# Patient Record
Sex: Female | Born: 1990 | Race: Black or African American | Hispanic: No | Marital: Single | Smoking: Never smoker
Health system: Southern US, Community
[De-identification: ages and names within clinical notes are randomized; demographics above are authoritative.]

## PROBLEM LIST (undated history)

## (undated) ENCOUNTER — Inpatient Hospital Stay (HOSPITAL_COMMUNITY): Payer: Self-pay

## (undated) DIAGNOSIS — Z789 Other specified health status: Secondary | ICD-10-CM

## (undated) HISTORY — PX: MULTIPLE TOOTH EXTRACTIONS: SHX2053

## (undated) HISTORY — PX: THERAPEUTIC ABORTION: SHX798

## (undated) HISTORY — PX: WISDOM TOOTH EXTRACTION: SHX21

---
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
## 2013-02-06 ENCOUNTER — Encounter (HOSPITAL_BASED_OUTPATIENT_CLINIC_OR_DEPARTMENT_OTHER): Payer: Self-pay | Admitting: *Deleted

## 2013-02-06 ENCOUNTER — Emergency Department (HOSPITAL_BASED_OUTPATIENT_CLINIC_OR_DEPARTMENT_OTHER)
Admission: EM | Admit: 2013-02-06 | Discharge: 2013-02-06 | Disposition: A | Discharge status: Home / Self Care | Payer: Medicaid Other | Attending: Emergency Medicine | Admitting: Emergency Medicine

## 2013-02-06 DIAGNOSIS — R05 Cough: Secondary | ICD-10-CM | POA: Insufficient documentation

## 2013-02-06 DIAGNOSIS — J3489 Other specified disorders of nose and nasal sinuses: Secondary | ICD-10-CM | POA: Insufficient documentation

## 2013-02-06 DIAGNOSIS — J069 Acute upper respiratory infection, unspecified: Secondary | ICD-10-CM | POA: Insufficient documentation

## 2013-02-06 DIAGNOSIS — J029 Acute pharyngitis, unspecified: Secondary | ICD-10-CM | POA: Insufficient documentation

## 2013-02-06 DIAGNOSIS — R059 Cough, unspecified: Secondary | ICD-10-CM | POA: Insufficient documentation

## 2013-02-06 NOTE — ED Provider Notes (Signed)
History     CSN: [id number]  Arrival date & time 02/06/13  1257   First MD Initiated Contact with Patient 02/06/13 1316      Chief Complaint  Patient presents with  . Otalgia    (Consider location/radiation/quality/duration/timing/severity/associated sxs/prior treatment) Patient is a 22 y.o. female presenting with ear pain. The history is provided by the patient. No language interpreter was used.  Otalgia Location:  Bilateral Behind ear:  No abnormality Quality:  Aching Onset quality:  Gradual Duration:  4 days Timing:  Constant Progression:  Unchanged Chronicity:  New Relieved by:  Nothing Worsened by:  Nothing tried Associated symptoms: congestion, cough, rhinorrhea and sore throat   Associated symptoms: no ear discharge, no fever and no rash     History reviewed. No pertinent past medical history.  History reviewed. No pertinent past surgical history.  No family history on file.  History  Substance Use Topics  . Smoking status: Never Smoker   . Smokeless tobacco: Not on file  . Alcohol Use: No    OB History   Grav Para Term Preterm Abortions TAB SAB Ect Mult Living                  Review of Systems  Constitutional: Negative for fever.  HENT: Positive for ear pain, congestion, sore throat and rhinorrhea. Negative for ear discharge.   Respiratory: Positive for cough.   Cardiovascular: Negative.   Skin: Negative for rash.    Allergies  Penicillins  Home Medications  No current outpatient prescriptions on file.  BP 121/70  Pulse 92  Temp(Src) 98.6 F (37 C) (Oral)  SpO2 99%  Physical Exam  Nursing note and vitals reviewed. Constitutional: She is oriented to person, place, and time. She appears well-developed and well-nourished.  HENT:  Head: Normocephalic and atraumatic.  Right Ear: External ear normal.  Left Ear: External ear normal.  Nose: Rhinorrhea present.  Mouth/Throat: Posterior oropharyngeal erythema present.  Eyes: Conjunctivae  are normal. Pupils are equal, round, and reactive to light.  Neck: Normal range of motion. Neck supple.  Cardiovascular: Normal rate and regular rhythm.   Pulmonary/Chest: Effort normal and breath sounds normal.  Musculoskeletal: Normal range of motion.  Neurological: She is alert and oriented to person, place, and time.  Skin: Skin is warm and dry.    ED Course  Procedures (including critical care time)  Labs Reviewed - No data to display No results found.   1. URI (upper respiratory infection)       MDM  Don't think pt needs antibiotics or imaging at this time:pt instructed on otc medications to use for symptoms        Teressa Lower, NP 02/06/13 1336

## 2013-02-06 NOTE — ED Notes (Signed)
Ear pain and sore throat x 4 days.

## 2013-02-06 NOTE — ED Provider Notes (Signed)
Medical screening examination/treatment/procedure(s) were performed by non-physician practitioner and as supervising physician I was immediately available for consultation/collaboration.   Robynn Marcel, MD 02/06/13 1617 

## 2013-12-12 ENCOUNTER — Encounter (HOSPITAL_BASED_OUTPATIENT_CLINIC_OR_DEPARTMENT_OTHER): Payer: Self-pay | Admitting: Emergency Medicine

## 2013-12-12 ENCOUNTER — Emergency Department (HOSPITAL_BASED_OUTPATIENT_CLINIC_OR_DEPARTMENT_OTHER)
Admission: EM | Admit: 2013-12-12 | Discharge: 2013-12-12 | Disposition: A | Discharge status: Home / Self Care | Payer: Medicaid Other | Attending: Emergency Medicine | Admitting: Emergency Medicine

## 2013-12-12 DIAGNOSIS — J069 Acute upper respiratory infection, unspecified: Secondary | ICD-10-CM | POA: Insufficient documentation

## 2013-12-12 DIAGNOSIS — Z880 Allergy status to penicillin: Secondary | ICD-10-CM | POA: Insufficient documentation

## 2013-12-12 LAB — RAPID STREP SCREEN (MED CTR MEBANE ONLY): Streptococcus, Group A Screen (Direct): NEGATIVE

## 2013-12-12 MED ORDER — LORATADINE 10 MG PO TABS: 10.0000 mg | ORAL_TABLET | Freq: Every day | ORAL | Status: DC

## 2013-12-12 NOTE — ED Provider Notes (Signed)
CSN: [id number]     Arrival date & time 12/12/13  1125 History   First MD Initiated Contact with Patient 12/12/13 1230     Chief Complaint  Patient presents with  . URI   (Consider location/radiation/quality/duration/timing/severity/associated sxs/prior Treatment) Patient is a 23 y.o. female presenting with URI. The history is provided by the patient.  URI Presenting symptoms: congestion, fever and sore throat   Presenting symptoms: no cough and no ear pain   Severity:  Moderate Onset quality:  Gradual Duration:  3 days Timing:  Constant Progression:  Worsening Chronicity:  New Relieved by:  Nothing Worsened by:  Nothing tried Ineffective treatments:  None tried Associated symptoms: headaches, myalgias and swollen glands    Donie Moulton is a 23 y.o. female who presents to the ED with nasal congestion, throat irritation and aching that started 3 days ago. She denies ear ache or cough. She has a low grade fever.   History reviewed. No pertinent past medical history. History reviewed. No pertinent past surgical history. No family history on file. History  Substance Use Topics  . Smoking status: Never Smoker   . Smokeless tobacco: Not on file  . Alcohol Use: No   OB History   Grav Para Term Preterm Abortions TAB SAB Ect Mult Living                 Review of Systems  Constitutional: Positive for fever.  HENT: Positive for congestion and sore throat. Negative for ear pain, sinus pressure and trouble swallowing.   Respiratory: Negative for cough and chest tightness.   Gastrointestinal: Negative for nausea, vomiting and abdominal pain.  Genitourinary: Negative for dysuria, urgency and frequency.  Musculoskeletal: Positive for myalgias.  Skin: Negative for rash.  Neurological: Positive for headaches. Negative for light-headedness.  Psychiatric/Behavioral: Negative for confusion. The patient is not nervous/anxious.     Allergies  Penicillins  Home Medications   Current  Outpatient Rx  Name  Route  Sig  Dispense  Refill  . medroxyPROGESTERone (DEPO-PROVERA) 150 MG/ML injection   Intramuscular   Inject 150 mg into the muscle every 3 (three) months.          BP 124/75  Temp(Src) 99.5 F (37.5 C) (Oral)  Resp 20  Ht 5\' 3"  (1.6 m)  Wt 136 lb 9 oz (61.944 kg)  BMI 24.20 kg/m2  SpO2 99% Physical Exam  Nursing note and vitals reviewed. Constitutional: She is oriented to person, place, and time. She appears well-developed and well-nourished.  HENT:  Head: Normocephalic and atraumatic.  Right Ear: Tympanic membrane normal.  Left Ear: Tympanic membrane normal.  Nose: Nose normal.  Mouth/Throat: Uvula is midline and mucous membranes are normal. Posterior oropharyngeal erythema present.  Eyes: Conjunctivae and EOM are normal.  Neck: Normal range of motion. Neck supple.  Cardiovascular: Normal rate, regular rhythm and normal heart sounds.   Pulmonary/Chest: Effort normal. No respiratory distress. She has no wheezes. She has no rales. She exhibits no tenderness.  Abdominal: Soft. There is no tenderness.  Musculoskeletal: Normal range of motion.  Lymphadenopathy:    She has cervical adenopathy (mild).  Neurological: She is alert and oriented to person, place, and time. No cranial nerve deficit.  Skin: Skin is warm and dry.  Psychiatric: She has a normal mood and affect. Her behavior is normal.   Results for orders placed during the hospital encounter of 12/12/13 (from the past 24 hour(s))  RAPID STREP SCREEN     Status: None   Collection  Time    12/12/13 12:45 PM      Result Value Range   Streptococcus, Group A Screen (Direct) NEGATIVE  NEGATIVE    ED Course  Procedures   MDM  23 y.o. female with congestion and sore throat. Stable for discharge without further screening. Negative strep screen. Will treat for congestion and she will take tylenol and ibuprofen as needed for aching. She will follow up with her PCP or return here as needed. She will use  humidifier. Discussed with the patient clinical and lab findings and plan of care. All questioned fully answered. She will returnif any problems arise.    Medication List    TAKE these medications       loratadine 10 MG tablet  Commonly known as:  CLARITIN  Take 1 tablet (10 mg total) by mouth daily.      ASK your doctor about these medications       medroxyPROGESTERone 150 MG/ML injection  Commonly known as:  DEPO-PROVERA  Inject 150 mg into the muscle every 3 (three) months.           [facility] Orlene OchM Kirsti Mcalpine, NP 12/12/13 1330

## 2013-12-12 NOTE — ED Notes (Signed)
Sinus congestion and sore throat for the last three days.

## 2013-12-12 NOTE — Discharge Instructions (Signed)
Cool Mist Vaporizers °Vaporizers may help relieve the symptoms of a cough and cold. They add moisture to the air, which helps mucus to become thinner and less sticky. This makes it easier to breathe and cough up secretions. Cool mist vaporizers do not cause serious burns like hot mist vaporizers ("steamers, humidifiers"). Vaporizers have not been proved to show they help with colds. You should not use a vaporizer if you are allergic to mold.  °HOME CARE INSTRUCTIONS °· Follow the package instructions for the vaporizer. °· Do not use anything other than distilled water in the vaporizer. °· Do not run the vaporizer all of the time. This can cause mold or bacteria to grow in the vaporizer. °· Clean the vaporizer after each time it is used. °· Clean and dry the vaporizer well before storing it. °· Stop using the vaporizer if worsening respiratory symptoms develop. °Document Released: 08/24/2004 Document Revised: 07/30/2013 Document Reviewed: 04/16/2013 °[employer]® Patient Information ©2014 [employer]. ° °

## 2013-12-12 NOTE — ED Provider Notes (Signed)
Medical screening examination/treatment/procedure(s) were performed by non-physician practitioner and as supervising physician I was immediately available for consultation/collaboration.     Macintyre Alexa, MD 12/12/13 1503 

## 2013-12-14 LAB — CULTURE, GROUP A STREP

## 2014-09-24 ENCOUNTER — Emergency Department (HOSPITAL_BASED_OUTPATIENT_CLINIC_OR_DEPARTMENT_OTHER)
Admission: EM | Admit: 2014-09-24 | Discharge: 2014-09-24 | Disposition: A | Discharge status: Home / Self Care | Payer: Medicaid Other | Attending: Emergency Medicine | Admitting: Emergency Medicine

## 2014-09-24 ENCOUNTER — Encounter (HOSPITAL_BASED_OUTPATIENT_CLINIC_OR_DEPARTMENT_OTHER): Payer: Self-pay | Admitting: Emergency Medicine

## 2014-09-24 DIAGNOSIS — R197 Diarrhea, unspecified: Secondary | ICD-10-CM | POA: Diagnosis not present

## 2014-09-24 DIAGNOSIS — Z3202 Encounter for pregnancy test, result negative: Secondary | ICD-10-CM | POA: Diagnosis not present

## 2014-09-24 DIAGNOSIS — Z880 Allergy status to penicillin: Secondary | ICD-10-CM | POA: Diagnosis not present

## 2014-09-24 DIAGNOSIS — Z79899 Other long term (current) drug therapy: Secondary | ICD-10-CM | POA: Diagnosis not present

## 2014-09-24 DIAGNOSIS — R1084 Generalized abdominal pain: Secondary | ICD-10-CM | POA: Insufficient documentation

## 2014-09-24 DIAGNOSIS — R109 Unspecified abdominal pain: Secondary | ICD-10-CM | POA: Diagnosis present

## 2014-09-24 LAB — URINALYSIS, ROUTINE W REFLEX MICROSCOPIC
Bilirubin Urine: NEGATIVE
Glucose, UA: NEGATIVE mg/dL
Hgb urine dipstick: NEGATIVE
Ketones, ur: 15 mg/dL — AB
Leukocytes, UA: NEGATIVE
Nitrite: NEGATIVE
Protein, ur: NEGATIVE mg/dL
Specific Gravity, Urine: 1.033 — ABNORMAL HIGH (ref 1.005–1.030)
Urobilinogen, UA: 1 mg/dL (ref 0.0–1.0)
pH: 5.5 (ref 5.0–8.0)

## 2014-09-24 LAB — PREGNANCY, URINE: Preg Test, Ur: NEGATIVE

## 2014-09-24 NOTE — ED Provider Notes (Signed)
Medical screening examination/treatment/procedure(s) were performed by non-physician practitioner and as supervising physician I was immediately available for consultation/collaboration.    Nelia Shiobert L Hans Rusher, MD 09/24/14 [phone]

## 2014-09-24 NOTE — ED Notes (Signed)
abd pain, nausea, diarrhea-states LMP 5 months ago after lat depoprovera

## 2014-09-24 NOTE — ED Provider Notes (Signed)
CSN: [id number]     Arrival date & time 09/24/14  1545 History   First MD Initiated Contact with Patient 09/24/14 1605     Chief Complaint  Patient presents with  . Abdominal Pain     (Consider location/radiation/quality/duration/timing/severity/associated sxs/prior Treatment) HPI Comments: Pt comes in with complaints of generalized abdominal pain for the last couple of weeks. Pt states that the pain worsened yesterday when she developed diarrhea. She states that she has had multiple diarrhea episodes, that are not bloody. No fever, vomiting, dysuria or vaginal discharge.  The history is provided by the patient. No language interpreter was used.    History reviewed. No pertinent past medical history. History reviewed. No pertinent past surgical history. No family history on file. History  Substance Use Topics  . Smoking status: Never Smoker   . Smokeless tobacco: Not on file  . Alcohol Use: No   OB History   Grav Para Term Preterm Abortions TAB SAB Ect Mult Living                 Review of Systems  All other systems reviewed and are negative.     Allergies  Penicillins  Home Medications   Prior to Admission medications   Medication Sig Start Date End Date Taking? Authorizing Provider  loratadine (CLARITIN) 10 MG tablet Take 1 tablet (10 mg total) by mouth daily. 12/12/13   Hope Orlene OchM Neese, NP  medroxyPROGESTERone (DEPO-PROVERA) 150 MG/ML injection Inject 150 mg into the muscle every 3 (three) months.    Historical Provider, MD   BP 123/82  Pulse 98  Temp(Src) 98.9 F (37.2 C) (Oral)  Resp 16  Ht 5\' 3"  (1.6 m)  Wt 155 lb 8 oz (70.534 kg)  BMI 27.55 kg/m2  SpO2 100% Physical Exam  Nursing note and vitals reviewed. Constitutional: She is oriented to person, place, and time. She appears well-developed and well-nourished.  HENT:  Head: Normocephalic and atraumatic.  Cardiovascular: Normal rate and regular rhythm.   Pulmonary/Chest: Effort normal and breath sounds  normal.  Abdominal: Bowel sounds are normal. There is no tenderness.  Musculoskeletal: Normal range of motion.  Neurological: She is alert and oriented to person, place, and time.  Skin: Skin is warm.  Psychiatric: She has a normal mood and affect.    ED Course  Procedures (including critical care time) Labs Review Labs Reviewed  URINALYSIS, ROUTINE W REFLEX MICROSCOPIC - Abnormal; Notable for the following:    APPearance CLOUDY (*)    Specific Gravity, Urine 1.033 (*)    Ketones, ur 15 (*)    All other components within normal limits  PREGNANCY, URINE    Imaging Review No results found.   EKG Interpretation None      MDM   Final diagnoses:  Diarrhea  Generalized abdominal pain    Abdomen is benign. Diarrhea is likely viral. Discussed follow upas needed.    Teressa LowerVrinda Trevan Messman, NP 09/24/14 1652

## 2014-09-24 NOTE — Discharge Instructions (Signed)

## 2014-11-24 ENCOUNTER — Encounter (HOSPITAL_BASED_OUTPATIENT_CLINIC_OR_DEPARTMENT_OTHER): Payer: Self-pay | Admitting: Emergency Medicine

## 2014-11-24 ENCOUNTER — Emergency Department (HOSPITAL_BASED_OUTPATIENT_CLINIC_OR_DEPARTMENT_OTHER)
Admission: EM | Admit: 2014-11-24 | Discharge: 2014-11-24 | Disposition: A | Discharge status: Home / Self Care | Payer: Medicaid Other | Attending: Emergency Medicine | Admitting: Emergency Medicine

## 2014-11-24 DIAGNOSIS — Z3202 Encounter for pregnancy test, result negative: Secondary | ICD-10-CM | POA: Diagnosis not present

## 2014-11-24 DIAGNOSIS — Z79899 Other long term (current) drug therapy: Secondary | ICD-10-CM | POA: Insufficient documentation

## 2014-11-24 DIAGNOSIS — Z202 Contact with and (suspected) exposure to infections with a predominantly sexual mode of transmission: Secondary | ICD-10-CM

## 2014-11-24 DIAGNOSIS — Z880 Allergy status to penicillin: Secondary | ICD-10-CM | POA: Diagnosis not present

## 2014-11-24 LAB — URINALYSIS, ROUTINE W REFLEX MICROSCOPIC
Bilirubin Urine: NEGATIVE
Glucose, UA: NEGATIVE mg/dL
Hgb urine dipstick: NEGATIVE
Ketones, ur: 15 mg/dL — AB
Leukocytes, UA: NEGATIVE
Nitrite: NEGATIVE
Protein, ur: NEGATIVE mg/dL
Specific Gravity, Urine: 1.031 — ABNORMAL HIGH (ref 1.005–1.030)
Urobilinogen, UA: 0.2 mg/dL (ref 0.0–1.0)
pH: 5 (ref 5.0–8.0)

## 2014-11-24 LAB — WET PREP, GENITAL
Trich, Wet Prep: NONE SEEN
Yeast Wet Prep HPF POC: NONE SEEN

## 2014-11-24 LAB — PREGNANCY, URINE: Preg Test, Ur: NEGATIVE

## 2014-11-24 MED ORDER — LIDOCAINE HCL (PF) 1 % IJ SOLN
INTRAMUSCULAR | Status: AC
Start: 2014-11-24 — End: 2014-11-24
  Administered 2014-11-24: 0.9 mL
  Filled 2014-11-24: qty 5

## 2014-11-24 MED ORDER — CEFTRIAXONE SODIUM 250 MG IJ SOLR
250.0000 mg | Freq: Once | INTRAMUSCULAR | Status: AC
  Administered 2014-11-24: 250 mg via INTRAMUSCULAR
  Filled 2014-11-24: qty 250

## 2014-11-24 MED ORDER — AZITHROMYCIN 250 MG PO TABS
1000.0000 mg | ORAL_TABLET | Freq: Once | ORAL | Status: AC
  Administered 2014-11-24: 1000 mg via ORAL
  Filled 2014-11-24: qty 4

## 2014-11-24 NOTE — ED Provider Notes (Signed)
CSN: [id number]     Arrival date & time 11/24/14  1015 History   First MD Initiated Contact with Patient 11/24/14 1353     Chief Complaint  Patient presents with  . SEXUALLY TRANSMITTED DISEASE   HPI  Patient is a 23 year old female who presents emergency room after being exposed to gonorrhea. She states that her boyfriend told her that he was exposed to gonorrhea. Patient states that her boyfriend was treated. She has not had any symptoms. She states that she has had no vaginal discharge, dysuria, urinary frequency, vaginal pain, dyspareunia, urinary urgency, abdominal pain, nausea, or vomiting. She states that she is otherwise healthy. She takes no medications.  History reviewed. No pertinent past medical history. History reviewed. No pertinent past surgical history. History reviewed. No pertinent family history. History  Substance Use Topics  . Smoking status: Never Smoker   . Smokeless tobacco: Not on file  . Alcohol Use: No   OB History    No data available     Review of Systems  Constitutional: Negative for fever, chills and fatigue.  Respiratory: Negative for chest tightness and shortness of breath.   Cardiovascular: Negative for chest pain and palpitations.  Gastrointestinal: Negative for nausea, vomiting, abdominal pain, diarrhea and constipation.  Genitourinary: Negative for dysuria, frequency, hematuria, vaginal bleeding, vaginal discharge, difficulty urinating and vaginal pain.  All other systems reviewed and are negative.     Allergies  Penicillins  Home Medications   Prior to Admission medications   Medication Sig Start Date End Date Taking? Authorizing Provider  loratadine (CLARITIN) 10 MG tablet Take 1 tablet (10 mg total) by mouth daily. 12/12/13   Hope Orlene OchM Neese, NP  medroxyPROGESTERone (DEPO-PROVERA) 150 MG/ML injection Inject 150 mg into the muscle every 3 (three) months.    Historical Provider, MD   BP 114/67 mmHg  Pulse 86  Temp(Src) 99 F (37.2 C)  (Oral)  Resp 18  Wt 148 lb (67.132 kg)  SpO2 100%  LMP 04/24/2014 Physical Exam  Constitutional: She is oriented to person, place, and time. She appears well-developed and well-nourished. No distress.  HENT:  Head: Normocephalic and atraumatic.  Mouth/Throat: Oropharynx is clear and moist. No oropharyngeal exudate.  Eyes: Conjunctivae and EOM are normal. Pupils are equal, round, and reactive to light. No scleral icterus.  Neck: Normal range of motion. Neck supple. No JVD present. No thyromegaly present.  Cardiovascular: Normal rate, regular rhythm, normal heart sounds and intact distal pulses.  Exam reveals no gallop and no friction rub.   No murmur heard. Pulmonary/Chest: Effort normal and breath sounds normal. No respiratory distress. She has no wheezes. She has no rales. She exhibits no tenderness.  Abdominal: Soft. Bowel sounds are normal. She exhibits no distension and no mass. There is no tenderness. There is no rebound and no guarding.  Genitourinary: No labial fusion. There is no rash, tenderness, lesion or injury on the right labia. There is no tenderness, lesion or injury on the left labia. Uterus is not deviated, not enlarged, not fixed and not tender. Cervix exhibits no motion tenderness, no discharge and no friability. Right adnexum displays no mass, no tenderness and no fullness. Left adnexum displays no mass, no tenderness and no fullness. No erythema, tenderness or bleeding in the vagina. No foreign body around the vagina. No signs of injury around the vagina. No vaginal discharge found.  Musculoskeletal: Normal range of motion.  Lymphadenopathy:    She has no cervical adenopathy.  Neurological: She is alert and  oriented to person, place, and time.  Skin: Skin is warm and dry. She is not diaphoretic.  Psychiatric: She has a normal mood and affect. Her behavior is normal. Judgment and thought content normal.  Nursing note and vitals reviewed.   ED Course  Procedures  (including critical care time) Labs Review Labs Reviewed  WET PREP, GENITAL - Abnormal; Notable for the following:    Clue Cells Wet Prep HPF POC FEW (*)    WBC, Wet Prep HPF POC FEW (*)    All other components within normal limits  URINALYSIS, ROUTINE W REFLEX MICROSCOPIC - Abnormal; Notable for the following:    APPearance CLOUDY (*)    Specific Gravity, Urine 1.031 (*)    Ketones, ur 15 (*)    All other components within normal limits  GC/CHLAMYDIA PROBE AMP  PREGNANCY, URINE    Imaging Review No results found.   EKG Interpretation None      MDM   Final diagnoses:  Exposure to gonorrhea   Patient is a 23 year old female who presents emergency room for evaluation after being exposed to gonorrhea. Physical exam is unremarkable. There is no CMT, adnexal tenderness, or purulent vaginal discharge on pelvic exam. No tenderness. Vital signs are stable. I have treated the patient presumptively with ceftriaxone and azithromycin 1 g. Patient has to leave the emergency room and cannot stay for wet prep results. She states that she will call and follow up on her results. I told her to abstain from sexual activity for 1 week. Her partner has already been tested. I have recommended using condoms for further STD protection. Patient refused HIV and RPR test here in the ED. I doubt PID or TOA at this time. Patient is stable for discharge. Patient to return for abdominal pain, worsening vaginal discharge, or any other concerning symptoms. She states understanding and agreement.    Eben Burowourtney A Forcucci, PA-C 11/24/14 1456  Vanetta MuldersScott Zackowski, MD 11/26/14 [phone]

## 2014-11-24 NOTE — Discharge Instructions (Signed)
Gonorrhea Culture This is a test for Neisseria gonorrhoeae, the bacteria that causes the sexually transmitted disease gonorrhea. Gonorrhea is easily treated but can cause severe reproductive and other health problems if left untreated. A swab is used to get a sample of secretion or discharge from the infected area such as the cervix, urethra, penis, anus, or throat. A urine sample is used in some tests. Many caregivers will take a sample from more than one body site to increase the likelihood of finding the bacteria.  NORMAL FINDINGS Your culture should be negative. This means that this culture showed no growth for gonorrhea. Ranges for normal findings may vary among different laboratories and hospitals. You should always check with your doctor after having lab work or other tests done to discuss the meaning of your test results and whether your values are considered within normal limits. MEANING OF TEST  Your caregiver will go over the test results with you and discuss the importance and meaning of your results, as well as treatment options and the need for additional tests. OBTAINING THE TEST RESULTS It is your responsibility to obtain your test results. Ask the lab or department performing the test when and how you will get your results. Document Released: 12/29/2004 Document Revised: 02/19/2012 Document Reviewed: 03/04/2014 [facility] Patient Information 2015 [place], [place]. This information is not intended to replace advice given to you by your health care provider. Make sure you discuss any questions you have with your health care provider.  Gonorrhea Gonorrhea is an infection that can cause serious problems. If left untreated, the infection may:   Damage the female or female organs.   Cause women to be unable to have children (sterility).   Harm a fetus if the infected woman is pregnant.  It is important to get treatment for gonorrhea as soon as possible. It is also necessary that all  your sexual partners be tested for the infection.  CAUSES  Gonorrhea is caused by bacteria called Neisseria gonorrhoeae. The infection is spread from person to person, usually by sexual contact (such as by anal, vaginal, or oral means). A newborn can contract the infection from his or her mother during birth.  SYMPTOMS  Some people with gonorrhea do not have symptoms. Symptoms may be different in females and males.  Females The most common symptoms are:   Pain in the lower abdomen.   Fever with or without chills.  Other symptoms include:   Abnormal vaginal discharge.   Painful intercourse.   Burning or itching of the vagina or lips of the vagina.   Abnormal vaginal bleeding.   Pain when urinating.   Long-lasting (chronic) pain in the lower abdomen, especially during menstruation or intercourse.   Inability to become pregnant.   Going into premature labor.   Irritation, pain, bleeding, or discharge from the rectum. This may occur if the infection was spread by anal sex.   Sore throat or swollen lymph nodes in the neck. This may occur if the infection was spread by oral sex.  Males The most common symptoms are:   Discharge from the penis.   Pain or burning during urination.   Pain or swelling in the testicles. Other symptoms may include:   Irritation, pain, bleeding, or discharge from the rectum. This may occur if the infection was spread by anal sex.   Sore throat, fever, or swollen lymph nodes in the neck. This may occur if the infection was spread by oral sex.  DIAGNOSIS  A diagnosis  is made after a physical exam is done and a sample of discharge is examined under a microscope for the presence of the bacteria. The discharge may be taken from the urethra, cervix, throat, or rectum.  TREATMENT  Gonorrhea is treated with antibiotic medicines. It is important for treatment to begin as soon as possible. Early treatment may prevent some problems from  developing.  HOME CARE INSTRUCTIONS   Take medicines only as directed by your health care provider.   Take your antibiotic medicine as directed by your health care provider. Finish the antibiotic even if you start to feel better. Incomplete treatment will put you at risk for continued infection.   Do not have sex until treatment is complete or as directed by your health care provider.   Keep all follow-up visits as directed by your health care provider.   Not all test results are available during your visit. If your test results are not back during the visit, make an appointment with your health care provider to find out the results. Do not assume everything is normal if you have not heard from your health care provider or the medical facility. It is your responsibility to get your test results.  If you test positive for gonorrhea, inform your recent sexual partners. They need to be checked for gonorrhea even if they do not have symptoms. They may need treatment, even if they test negative for gonorrhea.  SEEK MEDICAL CARE IF:   You develop any bad reaction to the medicine you were prescribed. This may include:   A rash.   Nausea.   Vomiting.   Diarrhea.   Your symptoms do not improve after a few days of taking antibiotics.   Your symptoms get worse.   You develop increased pain, such as in the testicles (for males) or in the abdomen (for females).  You have a fever. MAKE SURE YOU:   Understand these instructions.  Will watch your condition.  Will get help right away if you are not doing well or get worse. Document Released: 11/24/2000 Document Revised: 04/13/2014 Document Reviewed: 06/04/2013 [facility] Patient Information 2015 [place], [place]. This information is not intended to replace advice given to you by your health care provider. Make sure you discuss any questions you have with your health care provider.   Emergency Department Resource Guide 1) Find a  Doctor and Pay Out of Pocket Although you won't have to find out who is covered by your insurance plan, it is a good idea to ask around and get recommendations. You will then need to call the office and see if the doctor you have chosen will accept you as a new patient and what types of options they offer for patients who are self-pay. Some doctors offer discounts or will set up payment plans for their patients who do not have insurance, but you will need to ask so you aren't surprised when you get to your appointment.  2) Contact Your Local Health Department Not all health departments have doctors that can see patients for sick visits, but many do, so it is worth a call to see if yours does. If you don't know where your local health department is, you can check in your phone book. The CDC also has a tool to help you locate your state's health department, and many state websites also have listings of all of their local health departments.  3) Find a Walk-in Clinic If your illness is not likely to be very severe  or complicated, you may want to try a walk in clinic. These are popping up all over the country in pharmacies, drugstores, and shopping centers. They're usually staffed by nurse practitioners or physician assistants that have been trained to treat common illnesses and complaints. They're usually fairly quick and inexpensive. However, if you have serious medical issues or chronic medical problems, these are probably not your best option.  No Primary Care Doctor: - Call Health Connect at  [phone] - they can help you locate a primary care doctor that  accepts your insurance, provides certain services, etc. - Physician Referral Service- [phone]  Chronic Pain Problems: Organization         Address  Phone   Notes  [facility]  [phone] Patients need to be referred by their primary care doctor.   Medication Assistance: Organization         Address  Phone    Notes  [facility] Medication [facility] [address] [place], [place] [address] [phone] --Must be a resident of [facility] -- Must have NO insurance coverage whatsoever (no Medicaid/ Medicare, etc.) -- The pt. MUST have a primary care doctor that directs their care regularly and follows them in the community   Med[facility]ist  [phone]   [employer]  [phone]    Agencies that provide inexpensive medical care: Organization         Address  Phone   Notes  [facility]  [phone]   [facility]    [phone]   [facility] [address] [place], [place] [address] [phone]   [facility] [address] [place], [place] [phone]   Planned Parenthood    [phone]   [facility]    [phone]   Community [facility] and [facility]  [address], [place] Phone:  [phone], Fax:  [phone] Hours of Operation:  9 am - 6 pm, M-F.  Also accepts Medicaid/Medicare and self-pay.  [facility]  [address], [place] Phone: [phone], Fax: [phone]. Hours of Operation:  8:30 am - 5:30 pm, M-F.  Also accepts Medicaid and self-pay.  [facility] [place] [address], [place] Phone: [phone]   [facility] [address] Natasha Bence [place], [place] [phone], Ext. [phone] Mondays & Thursdays: 7-9 AM.  First 15 patients are seen on a first come, first serve basis.    Medicaid-accepting [facility] Providers:  Organization         Address  Phone   Notes  [facility] [address],  [phone] Also accepts self-pay patients.  [facility] [address] Laurell Josephs [place], [place]  [phone]   [facility] [address], [place] [phone]   [facility]  [facility] [address], [place] [phone]   Renaye Rakers [address], [place]   [phone] Only accepts [place] Access [place] patients after they have their name applied to their card.   Self-Pay (no insurance) in [facility]:  Organization         Address  Phone   Notes  Sickle Cell Patients, [facility] Medicine [address] [place], [place] [phone]   [facility] Urgent Care [address], [place] [phone]   [facility]  [address], [place] [phone]) [phone]   [facility]/Dr. Osei-Bonsu  [address] [place] [address], [place] or [phone], [place] [phone]) [phone] Phone number for both [place] and [place] locations is the same.  [facility]gent Medical and [facility] [address], [place] [phone](336) [phone]   [facility] [address] [place] [address], [place] or [address] [phone]) [phone]   [facility] [address], [place] [phone], phone; [phone](336) [phone], fax Sees patients 1st and 3rd Saturday of every month.  Must not qualify for public or private insurance (i.e. Medicaid, Medicare, [place] Health Choice, Veterans' Benefits)  Household income should be no more than 200% of the poverty level The clinic cannot treat you if you are pregnant or think you are pregnant  Sexually transmitted diseases are not treated at the clinic.    Dental Care: Organization         Address  Phone  Notes  [facility] [address], [place] [phone] Accepts children up to age 21 who are enrolled in [place] or [place] Health Choice; pregnant women with a Medicaid card; and children who have applied for Medicaid or [place] Health Choice, but were declined, whose parents can pay a reduced fee at time of service.  [facility] [place]  [address], [place] [phone]) [phone] Accepts children up to age 21 who are enrolled in [place] or [place] Health Choice; pregnant women with a Medicaid card; and children who have applied for Medicaid or [place] Health Choice, but were declined, whose parents can pay a reduced fee at time of service.  [facility]  [address], [place] [phone] Patients are seen by appointment only. Walk-ins are not accepted. [facility] will see patients 21 years of age and older. Monday - Tuesday (8am-5pm) Most Wednesdays (8:30-5pm) $30 per visit, cash only  [facility]  [address], [facility] [phone] Patients are seen by appointment only. Walk-ins are not accepted. [facility] will see patients 21 years of age and older. One Wednesday Evening (Monthly: Volunteer Based).  $30 per visit, cash only  [employer] [facility] [facility]  [phone]) [phone] for adults; Children under age 524, call Graduate Pediatric Dentistry at [phone]. Children aged 364-14, please call [phone]) [phone] to request a pediatric application.  Dental services are provided in all areas of dental care including fillings, crowns and bridges, complete and partial dentures, implants, gum treatment, root canals, and extractions. Preventive care is also provided. Treatment is provided to both adults and children. Patients are selected via a lottery and there is often a waiting list.   [facility] [address], [place]  [phone] [email or web address]   [facility] [address], [place], [place] [phone], Ext. [phone] Second and Fourth Thursday of each month, opens at 6:30 AM; Clinic ends at 9 AM.  Patients are seen on a first-come first-served basis, and a limited number are seen during each clinic.   [facility]  [address], [place], [place] [phone]) [phone]   Eligibility Requirements You must have lived in  [place], [place], or [place] counties for at least the last three months.   You cannot  be eligible for state or federal sponsored [place], including [employer], [place], or [employer].   You generally cannot be eligible for healthcare insurance through your employer.    How to apply: Eligibility screenings are held every Tuesday and Wednesday afternoon from 1:00 pm until 4:00 pm. You do not need an appointment for the interview!  [facility] [address], [place], [place] [phone]   [facility]  [phone]   [facility]  [phone]   [facility]  [phone]    Behavioral Health Resources in the Community: Intensive Outpatient Programs Organization         Address  Phone  Notes  [facility] Services [address], [place], [place] [phone]   [facility] Outpatient [address] [place], [place] [phone]   [facility] [address] [place], [place]  [phone]   [facility] [address]  [place], [place] [phone] or [phone]   Substance Abuse Resources Organization         Address  Phone  Notes  [facility] Drug Services  [phone]   [facility]  [phone]   [facility] [place]  [phone]   Floydene Flock  [phone]   Residential & Outpatient Substance Abuse Program  [phone]   Psychological Services Organization         Address  Phone  Notes  [facility] Behavioral Health  [phone]   [facility]  [phone]   [facility] Mental Health [address], [place] [phone] or [phone]    Mobile Crisis Teams Organization         Address  Phone  Notes  [facility], Mobile Crisis Care Unit  [phone]   [facility]  [address] [place], [place] [phone]   Doristine Locks [address] [place] [phone]    Self-Help/Support Groups Organization         Address  Phone             Notes  [facility] [place] - variety of support groups  [phone]- I7437963 Call for more information  Narcotics Anonymous (NA), [facility] [address], [employer] [place]  2 meetings at this location   Statistician         Address  Phone  Notes  [facility] [address],    [place]  [phone]   [facility]  [address] [place] [address], [place], [place] [phone]   [facility] [address] [place], [place] [phone] Admissions: 8am-3pm M-F  [facility] [address]    [place], [place] [phone]   [facility] [address] [place], [place], [place] [phone]   [facility] [address]  [place], [place] [phone]   [facility] - Intensive Outpatient [address] Laurell Josephs [address], [place], [place] [phone]   [facility] [address]  [place], [place] [phone] or [phone]   [facility] (RT[facility]) [address], [place], [place] [phone] Accepts Medicaid  [facility] [place] [address]  [place] [phone] Substance Abuse/Addiction Treatment   [facility] Organization         Address  Phone  Notes  [facility]  [phone]   Angie Fava, PhD [address]  [place], [place]   [phone] or [phone]   [facility]   [address] [place], [place] [phone]   [facility] [address] [place], [place] [phone] Insurance/Medicaid/sponsorship through [facility] and Families [address]                                    [place], [place] [phone] Therapy/tele-psych/case  [facility] [address], [place] [phone]    Dr. Lolly Mustache  [phone]   [facility] [place]  [employer] [facility] Dept. 1) [address] [place] 2) [address], [place] 3)  [address], [place] [phone]  [phone]   [facility] Child Abuse Hotline (830)824-8902 or [phone] (After Hours)

## 2014-11-24 NOTE — ED Notes (Signed)
Partner tested positive for Gonorrhea pt needs treatment '

## 2014-11-25 LAB — GC/CHLAMYDIA PROBE AMP
CT Probe RNA: NEGATIVE
GC Probe RNA: POSITIVE — AB

## 2014-11-26 ENCOUNTER — Telehealth (HOSPITAL_BASED_OUTPATIENT_CLINIC_OR_DEPARTMENT_OTHER): Payer: Self-pay | Admitting: Emergency Medicine

## 2014-11-30 ENCOUNTER — Telehealth: Payer: Self-pay | Admitting: *Deleted

## 2014-12-01 ENCOUNTER — Telehealth (HOSPITAL_BASED_OUTPATIENT_CLINIC_OR_DEPARTMENT_OTHER): Payer: Self-pay | Admitting: *Deleted

## 2014-12-23 ENCOUNTER — Telehealth (HOSPITAL_COMMUNITY): Payer: Self-pay

## 2014-12-23 NOTE — ED Notes (Signed)
Unable to contact pt by mail or telephone. Unable to communicate lab results or treatment changes. 

## 2014-12-31 ENCOUNTER — Encounter (HOSPITAL_BASED_OUTPATIENT_CLINIC_OR_DEPARTMENT_OTHER): Payer: Self-pay

## 2014-12-31 ENCOUNTER — Emergency Department (HOSPITAL_BASED_OUTPATIENT_CLINIC_OR_DEPARTMENT_OTHER)
Admission: EM | Admit: 2014-12-31 | Discharge: 2014-12-31 | Disposition: A | Discharge status: Home / Self Care | Payer: Medicaid Other | Attending: Emergency Medicine | Admitting: Emergency Medicine

## 2014-12-31 DIAGNOSIS — Z79899 Other long term (current) drug therapy: Secondary | ICD-10-CM | POA: Insufficient documentation

## 2014-12-31 DIAGNOSIS — Z880 Allergy status to penicillin: Secondary | ICD-10-CM | POA: Insufficient documentation

## 2014-12-31 DIAGNOSIS — Z3202 Encounter for pregnancy test, result negative: Secondary | ICD-10-CM | POA: Insufficient documentation

## 2014-12-31 DIAGNOSIS — K529 Noninfective gastroenteritis and colitis, unspecified: Secondary | ICD-10-CM

## 2014-12-31 LAB — URINE MICROSCOPIC-ADD ON

## 2014-12-31 LAB — URINALYSIS, ROUTINE W REFLEX MICROSCOPIC
Bilirubin Urine: NEGATIVE
Glucose, UA: NEGATIVE mg/dL
Hgb urine dipstick: NEGATIVE
Ketones, ur: 15 mg/dL — AB
Nitrite: NEGATIVE
Protein, ur: NEGATIVE mg/dL
Specific Gravity, Urine: 1.031 — ABNORMAL HIGH (ref 1.005–1.030)
Urobilinogen, UA: 0.2 mg/dL (ref 0.0–1.0)
pH: 5.5 (ref 5.0–8.0)

## 2014-12-31 LAB — PREGNANCY, URINE: Preg Test, Ur: NEGATIVE

## 2014-12-31 MED ORDER — ONDANSETRON HCL 4 MG/2ML IJ SOLN
4.0000 mg | Freq: Once | INTRAMUSCULAR | Status: AC
  Administered 2014-12-31: 4 mg via INTRAVENOUS
  Filled 2014-12-31: qty 2

## 2014-12-31 MED ORDER — PROMETHAZINE HCL 25 MG PO TABS
25.0000 mg | ORAL_TABLET | Freq: Four times a day (QID) | ORAL | Status: DC | PRN
Start: 2014-12-31 — End: 2015-02-01

## 2014-12-31 MED ORDER — SODIUM CHLORIDE 0.9 % IV BOLUS (SEPSIS)
1000.0000 mL | Freq: Once | INTRAVENOUS | Status: AC
  Administered 2014-12-31: 1000 mL via INTRAVENOUS

## 2014-12-31 NOTE — Discharge Instructions (Signed)

## 2014-12-31 NOTE — ED Provider Notes (Signed)
CSN: [id number]     Arrival date & time 12/31/14  0746 History   First MD Initiated Contact with Patient 12/31/14 0802     Chief Complaint  Patient presents with  . Diarrhea     (Consider location/radiation/quality/duration/timing/severity/associated sxs/prior Treatment) HPI Comments: Patient presents with vomiting and diarrhea. She states it started 2 days ago. She has ongoing vomiting and diarrhea. Her emesis is nonbloody and nonbilious. She has watery, nonbloody diarrhea. She denies any abdominal pain to me. She denies any urinary symptoms. She denies any fevers or chills. She states she thought her symptoms were better yesterday and seemed to get worse during the night again. She denies any recent travel or antibiotic usage.  Patient is a 24 y.o. female presenting with diarrhea.  Diarrhea Associated symptoms: vomiting   Associated symptoms: no abdominal pain, no arthralgias, no chills, no diaphoresis, no fever and no headaches     History reviewed. No pertinent past medical history. History reviewed. No pertinent past surgical history. No family history on file. History  Substance Use Topics  . Smoking status: Never Smoker   . Smokeless tobacco: Not on file  . Alcohol Use: No   OB History    No data available     Review of Systems  Constitutional: Negative for fever, chills, diaphoresis and fatigue.  HENT: Negative for congestion, rhinorrhea and sneezing.   Eyes: Negative.   Respiratory: Negative for cough, chest tightness and shortness of breath.   Cardiovascular: Negative for chest pain and leg swelling.  Gastrointestinal: Positive for nausea, vomiting and diarrhea. Negative for abdominal pain and blood in stool.  Genitourinary: Negative for frequency, hematuria, flank pain and difficulty urinating.  Musculoskeletal: Negative for back pain and arthralgias.  Skin: Negative for rash.  Neurological: Negative for dizziness, speech difficulty, weakness, numbness and  headaches.      Allergies  Penicillins  Home Medications   Prior to Admission medications   Medication Sig Start Date End Date Taking? Authorizing Provider  loratadine (CLARITIN) 10 MG tablet Take 1 tablet (10 mg total) by mouth daily. 12/12/13   Hope Orlene Och, NP  medroxyPROGESTERone (DEPO-PROVERA) 150 MG/ML injection Inject 150 mg into the muscle every 3 (three) months.    Historical Provider, MD   BP 119/66 mmHg  Pulse 91  Temp(Src) 98.8 F (37.1 C) (Oral)  Resp 16  Wt 150 lb (68.04 kg)  SpO2 100%  LMP  (LMP Unknown) Physical Exam  Constitutional: She is oriented to person, place, and time. She appears well-developed and well-nourished.  HENT:  Head: Normocephalic and atraumatic.  Eyes: Pupils are equal, round, and reactive to light.  Neck: Normal range of motion. Neck supple.  Cardiovascular: Normal rate, regular rhythm and normal heart sounds.   Pulmonary/Chest: Effort normal and breath sounds normal. No respiratory distress. She has no wheezes. She has no rales. She exhibits no tenderness.  Abdominal: Soft. Bowel sounds are normal. There is no tenderness. There is no rebound and no guarding.  Musculoskeletal: Normal range of motion. She exhibits no edema.  Lymphadenopathy:    She has no cervical adenopathy.  Neurological: She is alert and oriented to person, place, and time.  Skin: Skin is warm and dry. No rash noted.  Psychiatric: She has a normal mood and affect.    ED Course  Procedures (including critical care time) Labs Review Results for orders placed or performed during the hospital encounter of 12/31/14  Pregnancy, urine  Result Value Ref Range   Preg Test, Ur  NEGATIVE NEGATIVE  Urinalysis, Routine w reflex microscopic  Result Value Ref Range   Color, Urine AMBER (A) YELLOW   APPearance CLOUDY (A) CLEAR   Specific Gravity, Urine 1.031 (H) 1.005 - 1.030   pH 5.5 5.0 - 8.0   Glucose, UA NEGATIVE NEGATIVE mg/dL   Hgb urine dipstick NEGATIVE NEGATIVE    Bilirubin Urine NEGATIVE NEGATIVE   Ketones, ur 15 (A) NEGATIVE mg/dL   Protein, ur NEGATIVE NEGATIVE mg/dL   Urobilinogen, UA 0.2 0.0 - 1.0 mg/dL   Nitrite NEGATIVE NEGATIVE   Leukocytes, UA TRACE (A) NEGATIVE  Urine microscopic-add on  Result Value Ref Range   Squamous Epithelial / LPF MANY (A) RARE   WBC, UA 3-6 <3 WBC/hpf   RBC / HPF 0-2 <3 RBC/hpf   Bacteria, UA MANY (A) RARE   Urine-Other MUCOUS PRESENT    No results found.    Imaging Review No results found.   EKG Interpretation None      MDM   Final diagnoses:  Gastroenteritis    Patient presents with vomiting and diarrhea. She was given IV fluids and Zofran in the ED. She has no abdominal pain on exam. She's feeling much better after treatment in the ED. She's tolerating by mouth fluids. Her symptoms are consistent with a viral gastroenteritis. She was discharged home in good condition. She was given a prescription for Phenergan to use as needed for nausea. Return precautions were given.    Rolan BuccoMelanie Clyda Smyth, MD 12/31/14 1023

## 2014-12-31 NOTE — ED Notes (Signed)
Developed vomiting and diarrhea 2 days ago and now has abdominal pain.

## 2015-02-01 ENCOUNTER — Encounter (HOSPITAL_COMMUNITY): Payer: Self-pay | Admitting: *Deleted

## 2015-02-01 ENCOUNTER — Inpatient Hospital Stay (HOSPITAL_COMMUNITY)
Admission: AD | Admit: 2015-02-01 | Discharge: 2015-02-01 | Disposition: A | Discharge status: Home / Self Care | Payer: Medicaid Other | Source: Ambulatory Visit | Attending: Obstetrics & Gynecology | Admitting: Obstetrics & Gynecology

## 2015-02-01 DIAGNOSIS — Z3201 Encounter for pregnancy test, result positive: Secondary | ICD-10-CM | POA: Diagnosis not present

## 2015-02-01 DIAGNOSIS — Z3200 Encounter for pregnancy test, result unknown: Secondary | ICD-10-CM | POA: Diagnosis present

## 2015-02-01 MED ORDER — PRENATAL VITAMINS 28-0.8 MG PO TABS: 1.0000 | ORAL_TABLET | Freq: Every day | ORAL | Status: DC

## 2015-02-01 NOTE — MAU Note (Signed)
UPT was positive, urine sent to lab

## 2015-02-01 NOTE — MAU Note (Signed)
Pos HPT 2 days ago, wants to know how far along she is.  LMP 1/20.  Denies pain or bleeding.

## 2015-02-01 NOTE — Discharge Instructions (Signed)
Pregnancy, The Father's Role A father has an important role during their partners pregnancy, labor, delivery and afterward. It is important to help and support your partner through this new period. There are many physical and emotional changes that happen. To be helpful and supportive during this time, you should know and understand what is happening to your partner during the pregnancy, labor, delivery and postpartum period.  PREGNANCY Pregnancy lasts 40 weeks (plus or minus 2 weeks). The pregnancy is divided into three trimesters.   In the first 13 weeks, the mother feels tired, has painful breasts, may feel sick to her stomach (nauseated), throw up (vomit), urinates more often and may have mood changes. All of these changes are normal. If the father is aware of these, he can be more helpful, supportive and understanding. This may include helping with household duties and activities and spending more time with each other.  In the next 14 to 28 weeks, your partner is over the tiredness, nausea and vomiting. She will likely feel better and more energetic. This is the best time of the pregnancy to be more active together, go out more often or take trips. You will be able to see her belly popping out with the pregnancy. You may be able to feel the baby kick.  In the last 12 weeks, she may become more uncomfortable again because her abdomen is popping out more as the baby grows. She may have a hard time doing household chores, her balance may be off, she may have a hard time bending over, tires easily and has a tough time sleeping. At this time, you will realize the birth of your baby is close. You and your partner may have concerns about the safety of your partner and if the baby will be normal and healthy. These are all normal and natural feelings. You should talk with each other and your caregiver if you have any questions. Attend prenatal care visits with your partner. This is a good time for you to get  to know your caregiver, follow the pregnancy and ask questions. Prenatal visits are once a month for 6 months, then they are every 2 weeks for 2 months and then once a week the last month. You may have more prenatal visits if your caregiver feels it is needed. Your caregiver usually does an ultrasound of the baby at one of the prenatal visits or more often if needed. It is an exciting and emotional to see the baby moving and the heart beating.  Fathers can experience emotional changes during this time as well. These emotions can include happiness, excitement and feeling proud. Fathers may also be concerned about having new responsibilities. These include financial, educational and if it will change the relationship with his partner. These feelings are normal. They should be talked about openly and positively with each other. An important and often asked question is if sexual intercourse is safe during pregnancy and if it will harm the baby. Sexual intercourse is safe unless there is a problem with the pregnancy and your caregiver advises you to not have sexual intercourse. Because physical and emotional changes happen in pregnancy, your partner may not want to have sex during certain times. This is mostly true in the first and third trimesters. Trying different positions may make sexual intercourse comfortable. It is important for the both of you to discuss your feelings and desires with this problem. Talk to your caregiver about any questions you have about sexual intercourse during the  pregnancy. LABOR AND DELIVERY There are childbirth classes available for couples to take together. They help you understand what happens during labor and delivery. They also teach you how to help your partner with her labor pains, how to relax, breath properly during a contraction and focus on what is happening during labor. You may be asked to time the contractions, massage her back and breath with her during the contractions.  You are also there to see and enjoy the excitement your baby being born. If you have any feelings of fainting or are uncomfortable, tell someone to help you. You may be asked to leave the room if a problem develops during the labor or delivery. Sometimes a Cesarean Section (C-section) is scheduled or is an emergency during labor and delivery. A C-section is a major operation to deliver the baby. It is done through an incision in the abdomen and uterus. Your partner will be given a medicine to make her sleep (general anesthesia) or spinal anesthesia (numbing the body from the waist down). Most hospitals allow the father in the room for a C-section unless it is an emergency. Recovery from a C-section takes longer, is more uncomfortable and will require more help from the father. AFTER DELIVERY After the baby is born, the mother goes through many changes again. These changes could last 4 to 6 weeks or longer following a C-section. It is not unusual to be anxious, concerned and afraid that you may not be taking care of your newborn baby properly. Your partner may take a while to regain her strength. She may also get feelings of sadness (postpartum blues or depression), which is a more serious condition that may require medical treatment.  Your partner may decide to breastfeed the baby. This helps with bonding between the mother and the baby. Breastfeeding is the best way to feed the baby, but you may feel "left out." However, you can feel included by burping the baby and bottle feeding the baby with breast milk (collected by the mother) to give your partner some rest. This also helps you to bond with the baby. Breastfeeding mothers can get pregnant even if they are not having menstrual periods. Therefore, some form of birth control should be used if you do not want to get pregnant. Another question and concern is when it is safe to have sexual intercourse again. Usually it takes 4 to 6 weeks for healing to be over  with. It may take longer after a C-section. If you have any questions about having sexual intercourse or if it is painful, talk to your caregiver. As a father, you will be adjusting your role as the baby grows. Fatherhood is a on-going [employer]. You and your partner should still make time to be together alone and be the couple you were before the baby was born. This is helpful for you, your partner and your baby. As you can see, it is important for a father to be helpful, understanding and supportive during this special time. Document Released: 05/15/2008 Document Revised: 02/19/2012 Document Reviewed: 05/15/2008 [facility] Patient Information 2015 [place], [place]. This information is not intended to replace advice given to you by your health care provider. Make sure you discuss any questions you have with your health care provider.  Pregnancy Tests HOW DO PREGNANCY TESTS WORK? All pregnancy tests look for a special hormone in the urine or blood that is only present in pregnant women. This hormone, human chorionic gonadotropin (hCG), is also called the pregnancy hormone.  WHAT IS THE DIFFERENCE BETWEEN A URINE AND A BLOOD PREGNANCY TEST? IS ONE BETTER THAN THE OTHER? There are two types of pregnancy tests.  Blood tests.  Urine tests. Both tests look for the presence of hCG, the pregnancy hormone. Many women use a urine test or home pregnancy test (HPT) to find out if they are pregnant. HPTs are cheap, easy to use, can be done at home, and are private. When a woman has a positive result on an HPT, she needs to see her caregiver right away. The caregiver can confirm a positive HPT result with another urine test, a blood test, ultrasound, and a pelvic exam.  There are two types of blood tests you can get from a caregiver.   A quantitative blood test (or the beta hCG test). This test measures the exact amount of hCG in the blood. This means it can pick up very small amounts of hCG, making it a  very accurate test.  A qualitative hCG blood test. This test gives a simple yes or no answer to whether you are pregnant. This test is more like a urine test in terms of its accuracy. Blood tests can pick up hCG earlier in a pregnancy than urine tests can. Blood tests can tell if you are pregnant about 6 to 8 days after you release an egg from an ovary (ovulate). Urine tests can determine pregnancy about 2 weeks after ovulation.  HOW IS A HOME PREGNANCY TEST DONE?  There are many types of home pregnancy tests or HPTs that can be bought over-the-counter at drug or discount stores.   Some involve collecting your urine in a cup and dipping a stick into the urine or putting some of the urine into a special container with an eyedropper.  Others are done by placing a stick into your urine stream.  Tests vary in how long you need to wait for the stick or container to turn a certain color or have a symbol on it (like a plus or a minus).  All tests come with written instructions. Most tests also have toll-free phone numbers to call if you have any questions about how to do the test or read the results. HOW ACCURATE ARE HOME PREGNANCY TESTS?  HPTs are very accurate. Most brands of HPTs say they are 97% to 99% accurate when taken 1 week after missing your menstrual period, but this can vary with actual use. Each brand varies in how sensitive it is in picking up the pregnancy hormone hCG. If a test is not done correctly, it will be less accurate. Always check the package to make sure it is not past its expiration date. If it is, it will not be accurate. Most brands of HPTs tell users to do the test again in a few days, no matter what the results.  If you use an HPT too early in your pregnancy, you may not have enough of the pregnancy hormone hCG in your urine to have a positive test result. Most HPTs will be accurate if you test yourself around the time your period is due (about 2 weeks after you ovulate). You  can get a negative test result if you are not pregnant or if you ovulated later than you thought you did. You may also have problems with the pregnancy, which affects the amount of hCG you have in your urine. If your HPT is negative, test yourself again within a few days to 1 week. If you keep getting a  negative result and think you are pregnant, talk with your caregiver right away about getting a blood pregnancy test.  FALSE POSITIVE PREGNANCY TEST A false positive HPT can happen if there is blood or protein present in your urine. A false positive can also happen if you were recently pregnant or if you take a pregnancy test too soon after taking fertility drug that contains hCG. Also, some prescription medicines such as water pills (diuretics), tranquilizers, seizure medicines, psychiatric medicines, and allergy and nausea medicines (promethazine) give false positive readings. FALSE NEGATIVE PREGNANCY TEST  A false negative HPT can happen if you do the test too early. Try to wait until you are at least 1 day late for your menstrual period.  It may happen if you wait too long to test the urine (longer than 15 minutes).  It may also happen if the urine is too diluted because you drank a lot of fluids before getting the urine sample. It is best to test the first morning urine after you get out of bed. If your menstrual period did not start after a week of a negative HPT, repeat the pregnancy test. CAN ANYTHING INTERFERE WITH HOME PREGNANCY TEST RESULTS?  Most medicines, both over-the-counter and prescription drugs, including birth control pills and antibiotics, should not affect the results of a HPT. Only those drugs that have the pregnancy hormone hCG in them can give a false positive test result. Drugs that have hCG in them may be used for treating infertility (not being able to get pregnant). Alcohol and illegal drugs do not affect HPT results, but you should not be using these substances if you are  trying to get pregnant. If you have a positive pregnancy test, call your caregiver to make an appointment to begin prenatal care. Document Released: 11/30/2003 Document Revised: 02/19/2012 Document Reviewed: 03/13/2014 [facility] Patient Information 2015 [place], [place]. This information is not intended to replace advice given to you by your health care provider. Make sure you discuss any questions you have with your health care provider.  Prenatal Care  WHAT IS PRENATAL CARE?  Prenatal care means health care during your pregnancy, before your baby is born. It is very important to take care of yourself and your baby during your pregnancy by:   Getting early prenatal care. If you know you are pregnant, or think you might be pregnant, call your health care provider as soon as possible. Schedule a visit for a prenatal exam.  Getting regular prenatal care. Follow your health care provider's schedule for blood and other necessary tests. Do not miss appointments.  Doing everything you can to keep yourself and your baby healthy during your pregnancy.  Getting complete care. Prenatal care should include evaluation of the medical, dietary, educational, psychological, and social needs of you and your significant other. The medical and genetic history of your family and the family of your baby's father should be discussed with your health care provider.  Discussing with your health care provider:  Prescription, over-the-counter, and herbal medicines that you take.  Any history of substance abuse, alcohol use, smoking, and illegal drug use.  Any history of domestic abuse and violence.  Immunizations you have received.  Your nutrition and diet.  The amount of exercise you do.  Any environmental and occupational hazards to which you are exposed.  History of sexually transmitted infections for both you and your partner.  Previous pregnancies you have had. WHY IS PRENATAL CARE SO IMPORTANT?  By  regularly seeing your  health care provider, you help ensure that problems can be identified early so that they can be treated as soon as possible. Other problems might be prevented. Many studies have shown that early and regular prenatal care is important for the health of mothers and their babies.  HOW CAN I TAKE CARE OF MYSELF WHILE I AM PREGNANT?  Here are ways to take care of yourself and your baby:   Start or continue taking your multivitamin with 400 micrograms (mcg) of folic acid every day.  Get early and regular prenatal care. It is very important to see a health care provider during your pregnancy. Your health care provider will check at each visit to make sure that you and your baby are healthy. If there are any problems, action can be taken right away to help you and your baby.  Eat a healthy diet that includes:  Fruits.  Vegetables.  Foods low in saturated fat.  Whole grains.  Calcium-rich foods, such as milk, yogurt, and hard cheeses.  Drink 6-8 glasses of liquids a day.  Unless your health care provider tells you not to, try to be physically active for 30 minutes, most days of the week. If you are pressed for time, you can get your activity in through 10-minute segments, three times a day.  Do not smoke, drink alcohol, or use drugs. These can cause long-term damage to your baby. Talk with your health care provider about steps to take to stop smoking. Talk with a member of your faith community, a counselor, a trusted friend, or your health care provider if you are concerned about your alcohol or drug use.  Ask your health care provider before taking any medicine, even over-the-counter medicines. Some medicines are not safe to take during pregnancy.  Get plenty of rest and sleep.  Avoid hot tubs and saunas during pregnancy.  Do not have X-rays taken unless absolutely necessary and with the recommendation of your health care provider. A lead shield can be placed on your  abdomen to protect your baby when X-rays are taken in other parts of your body.  Do not empty the cat litter when you are pregnant. It may contain a parasite that causes an infection called toxoplasmosis, which can cause birth defects. Also, use gloves when working in garden areas used by cats.  Do not eat uncooked or undercooked meats or fish.  Do not eat soft, mold-ripened cheeses (Brie, Camembert, and chevre) or soft, blue-veined cheese (Danish blue and Roquefort).  Stay away from toxic chemicals like:  Insecticides.  Solvents (some cleaners or paint thinners).  Lead.  Mercury.  Sexual intercourse may continue until the end of the pregnancy, unless you have a medical problem or there is a problem with the pregnancy and your health care provider tells you not to.  Do not wear high-heel shoes, especially during the second half of the pregnancy. You can lose your balance and fall.  Do not take long trips, unless absolutely necessary. Be sure to see your health care provider before going on the trip.  Do not sit in one position for more than 2 hours when on a trip.  Take a copy of your medical records when going on a trip. Know where a hospital is located in the city you are visiting, in case of an emergency.  Most dangerous household products will have pregnancy warnings on their labels. Ask your health care provider about products if you are unsure.  Limit or eliminate your caffeine  intake from coffee, tea, sodas, medicines, and chocolate.  Many women continue working through pregnancy. Staying active might help you stay healthier. If you have a question about the safety or the hours you work at your particular job, talk with your health care provider.  Get informed:  Read books.  Watch videos.  Go to childbirth classes for you and your significant other.  Talk with experienced moms.  Ask your health care provider about childbirth education classes for you and your  partner. Classes can help you and your partner prepare for the birth of your baby.  Ask about a baby doctor (pediatrician) and methods and pain medicine for labor, delivery, and possible cesarean delivery. HOW OFTEN SHOULD I SEE MY HEALTH CARE PROVIDER DURING PREGNANCY?  Your health care provider will give you a schedule for your prenatal visits. You will have visits more often as you get closer to the end of your pregnancy. An average pregnancy lasts about 40 weeks.  A typical schedule includes visiting your health care provider:   About once each month during your first 6 months of pregnancy.  Every 2 weeks during the next 2 months.  Weekly in the last month, until the delivery date. Your health care provider will probably want to see you more often if:  You are older than 35 years.  Your pregnancy is high risk because you have certain health problems or problems with the pregnancy, such as:  Diabetes.  High blood pressure.  The baby is not growing on schedule, according to the dates of the pregnancy. Your health care provider will do special tests to make sure you and your baby are not having any serious problems. WHAT HAPPENS DURING PRENATAL VISITS?   At your first prenatal visit, your health care provider will do a physical exam and talk to you about your health history and the health history of your partner and your family. Your health care provider will be able to tell you what date to expect your baby to be born on.  Your first physical exam will include checks of your blood pressure, measurements of your height and weight, and an exam of your pelvic organs. Your health care provider will do a Pap test if you have not had one recently and will do cultures of your cervix to make sure there is no infection.  At each prenatal visit, there will be tests of your blood, urine, blood pressure, weight, and the progress of the baby will be checked.  At your later prenatal visits, your  health care provider will check how you are doing and how your baby is developing. You may have a number of tests done as your pregnancy progresses.  Ultrasound exams are often used to check on your baby's growth and health.  You may have more urine and blood tests, as well as special tests, if needed. These may include amniocentesis to examine fluid in the pregnancy sac, stress tests to check how the baby responds to contractions, or a biophysical profile to measure your baby's well-being. Your health care provider will explain the tests and why they are necessary.  You should be tested for high blood sugar (gestational diabetes) between the 24th and 28th weeks of your pregnancy.  You should discuss with your health care provider your plans to breastfeed or bottle-feed your baby.  Each visit is also a chance for you to learn about staying healthy during pregnancy and to ask questions. Document Released: 11/30/2003 Document Revised: 12/02/2013  Document Reviewed: 02/11/2014 [facility] Patient Information 2015 [place]. This information is not intended to replace advice given to you by your health care provider. Make sure you discuss any questions you have with your health care provider.

## 2015-02-01 NOTE — [facility] Provider Note (Signed)
Subjective:   [name] is a 24 y.o. female G1P0 at [id number] who presents to [facility] for a pregnancy test.  She denies pain or bleeding.  She would like an RX for prenatal vitamins and a pregnancy verification letter.    Objective:  GENERAL: Well-developed, well-nourished female in no acute distress.  HEENT: Normocephalic, atraumatic.   LUNGS: Effort normal HEART: Regular rate  SKIN: Warm, dry and without erythema PSYCH: Normal mood and affect  Filed Vitals:   02/01/15 1231  BP: 112/67  Pulse: 92  Temp: 97.9 F (36.6 C)  Resp: 16   MDM Urine pregnancy test positive.  Pregnancy verification letter given    Assessment:  1. Pregnancy test positive      Plan:  Discharge home in stable condition  Start prenatal care as soon as possible  RX: prenatal vitamins      Cindy HansenJennifer Irene Rasch, NP 02/01/2015 1:56 PM

## 2015-02-15 ENCOUNTER — Encounter (HOSPITAL_BASED_OUTPATIENT_CLINIC_OR_DEPARTMENT_OTHER): Payer: Self-pay | Admitting: *Deleted

## 2015-02-15 ENCOUNTER — Emergency Department (HOSPITAL_BASED_OUTPATIENT_CLINIC_OR_DEPARTMENT_OTHER)
Admission: EM | Admit: 2015-02-15 | Discharge: 2015-02-15 | Disposition: A | Discharge status: Home / Self Care | Payer: Medicaid Other | Attending: Emergency Medicine | Admitting: Emergency Medicine

## 2015-02-15 DIAGNOSIS — Z3A01 Less than 8 weeks gestation of pregnancy: Secondary | ICD-10-CM | POA: Diagnosis not present

## 2015-02-15 DIAGNOSIS — Z79899 Other long term (current) drug therapy: Secondary | ICD-10-CM | POA: Insufficient documentation

## 2015-02-15 DIAGNOSIS — O210 Mild hyperemesis gravidarum: Secondary | ICD-10-CM | POA: Diagnosis not present

## 2015-02-15 DIAGNOSIS — R112 Nausea with vomiting, unspecified: Secondary | ICD-10-CM

## 2015-02-15 DIAGNOSIS — Z880 Allergy status to penicillin: Secondary | ICD-10-CM | POA: Diagnosis not present

## 2015-02-15 DIAGNOSIS — Z3490 Encounter for supervision of normal pregnancy, unspecified, unspecified trimester: Secondary | ICD-10-CM

## 2015-02-15 LAB — BASIC METABOLIC PANEL
Anion gap: 6 (ref 5–15)
BUN: 11 mg/dL (ref 6–23)
CO2: 25 mmol/L (ref 19–32)
Calcium: 8.9 mg/dL (ref 8.4–10.5)
Chloride: 103 mmol/L (ref 96–112)
Creatinine, Ser: 0.72 mg/dL (ref 0.50–1.10)
GFR calc Af Amer: >90 mL/min (ref >90)
GFR calc non Af Amer: >90 mL/min (ref >90)
Glucose, Bld: 84 mg/dL (ref 70–99)
Potassium: 3.6 mmol/L (ref 3.5–5.1)
Sodium: 134 mmol/L — ABNORMAL LOW (ref 135–145)

## 2015-02-15 LAB — CBC WITH DIFFERENTIAL/PLATELET
Basophils Absolute: 0 10*3/uL (ref 0.0–0.1)
Basophils Relative: 0 % (ref 0–1)
Eosinophils Absolute: 0 10*3/uL (ref 0.0–0.7)
Eosinophils Relative: 0 % (ref 0–5)
HCT: 40.2 % (ref 36.0–46.0)
Hemoglobin: 13.5 g/dL (ref 12.0–15.0)
Lymphocytes Relative: 10 % — ABNORMAL LOW (ref 12–46)
Lymphs Abs: 1.1 10*3/uL (ref 0.7–4.0)
MCH: 28 pg (ref 26.0–34.0)
MCHC: 33.6 g/dL (ref 30.0–36.0)
MCV: 83.2 fL (ref 78.0–100.0)
Monocytes Absolute: 0.9 10*3/uL (ref 0.1–1.0)
Monocytes Relative: 8 % (ref 3–12)
Neutro Abs: 9.4 10*3/uL — ABNORMAL HIGH (ref 1.7–7.7)
Neutrophils Relative %: 82 % — ABNORMAL HIGH (ref 43–77)
Platelets: 264 10*3/uL (ref 150–400)
RBC: 4.83 MIL/uL (ref 3.87–5.11)
RDW: 14 % (ref 11.5–15.5)
WBC: 11.4 10*3/uL — ABNORMAL HIGH (ref 4.0–10.5)

## 2015-02-15 MED ORDER — PROMETHAZINE HCL 25 MG/ML IJ SOLN
12.5000 mg | Freq: Once | INTRAMUSCULAR | Status: AC
  Administered 2015-02-15: 12.5 mg via INTRAVENOUS
  Filled 2015-02-15: qty 1

## 2015-02-15 MED ORDER — PROMETHAZINE HCL 25 MG PO TABS: 25.0000 mg | ORAL_TABLET | Freq: Four times a day (QID) | ORAL | Status: DC | PRN

## 2015-02-15 MED ORDER — SODIUM CHLORIDE 0.9 % IV BOLUS (SEPSIS)
1000.0000 mL | Freq: Once | INTRAVENOUS | Status: AC
  Administered 2015-02-15: 1000 mL via INTRAVENOUS

## 2015-02-15 NOTE — Discharge Instructions (Signed)

## 2015-02-15 NOTE — ED Notes (Signed)
Pt sts she has been vomiting since last week. Pt deneis any other symptoms. [facility] First pregnancy.

## 2015-02-15 NOTE — ED Provider Notes (Signed)
CSN: [id number]     Arrival date & time 02/15/15  1401 History   First MD Initiated Contact with Patient 02/15/15 1418     Chief Complaint  Patient presents with  . Emesis     (Consider location/radiation/quality/duration/timing/severity/associated sxs/prior Treatment) HPI Comments: Patient is a 24 year old female G1 at 6-[redacted] weeks gestation. She presents for evaluation of vomiting. She states she has been vomiting intermittently since last week. She had a positive pregnancy test performed at [facility] hospital and was told she was approximately [redacted] weeks pregnant. Abdominal pain. She denies any vaginal bleeding or spotting.  Patient is a 24 y.o. female presenting with vomiting. The history is provided by the patient.  Emesis Severity:  Moderate Duration:  5 days Timing:  Intermittent Progression:  Worsening Chronicity:  New Recent urination:  Normal Relieved by:  Nothing Worsened by:  Nothing tried Ineffective treatments:  None tried Associated symptoms: no diarrhea, no fever and no sore throat     History reviewed. No pertinent past medical history. History reviewed. No pertinent past surgical history. No family history on file. History  Substance Use Topics  . Smoking status: Never Smoker   . Smokeless tobacco: Not on file  . Alcohol Use: No   OB History    Gravida Para Term Preterm AB TAB SAB Ectopic Multiple Living   1              Review of Systems  HENT: Negative for sore throat.   Gastrointestinal: Positive for vomiting. Negative for diarrhea.  All other systems reviewed and are negative.     Allergies  Penicillins  Home Medications   Prior to Admission medications   Medication Sig Start Date End Date Taking? Authorizing Provider  loratadine (CLARITIN) 10 MG tablet Take 1 tablet (10 mg total) by mouth daily. 12/12/13   Hope Orlene Och, NP  Prenatal Vit-Fe Fumarate-FA (PRENATAL VITAMINS) 28-0.8 MG TABS Take 1 tablet by mouth daily. 02/01/15   Harolyn Rutherford Rasch, NP    BP 116/71 mmHg  Pulse 100  Temp(Src) 98.4 F (36.9 C) (Oral)  Resp 18  Ht  (1.575 m)  Wt 145 lb (65.772 kg)  BMI 26.51 kg/m2  SpO2 98%  LMP 12/30/2014 Physical Exam  Constitutional: She is oriented to person, place, and time. She appears well-developed and well-nourished. No distress.  HENT:  Head: Normocephalic and atraumatic.  Neck: Normal range of motion. Neck supple.  Cardiovascular: Normal rate and regular rhythm.  Exam reveals no gallop and no friction rub.   No murmur heard. Pulmonary/Chest: Effort normal and breath sounds normal. No respiratory distress. She has no wheezes.  Abdominal: Soft. Bowel sounds are normal. She exhibits no distension. There is no tenderness.  Musculoskeletal: Normal range of motion.  Neurological: She is alert and oriented to person, place, and time.  Skin: Skin is warm and dry. She is not diaphoretic.  Nursing note and vitals reviewed.   ED Course  Procedures (including critical care time) Labs Review Labs Reviewed  BASIC METABOLIC PANEL  CBC WITH DIFFERENTIAL/PLATELET    Imaging Review No results found.   EKG Interpretation None      MDM   Final diagnoses:  None    Patient presents with complaints of nausea and vomiting. She found out last week that she was pregnant. She is having no abdominal pain, vaginal bleeding, or spotting. She was hydrated with normal saline and her laboratory studies are reassuring. She will be discharged with diclegis and follow-up with her  Geanie BerlinB.    Tavonna Worthington, MD 02/15/15 1534

## 2015-02-25 ENCOUNTER — Inpatient Hospital Stay (HOSPITAL_COMMUNITY)
Admission: AD | Admit: 2015-02-25 | Discharge: 2015-02-25 | Disposition: A | Discharge status: Home / Self Care | Payer: Medicaid Other | Source: Ambulatory Visit | Attending: Family Medicine | Admitting: Family Medicine

## 2015-02-25 ENCOUNTER — Encounter (HOSPITAL_COMMUNITY): Payer: Self-pay

## 2015-02-25 DIAGNOSIS — Z3A08 8 weeks gestation of pregnancy: Secondary | ICD-10-CM | POA: Insufficient documentation

## 2015-02-25 DIAGNOSIS — O219 Vomiting of pregnancy, unspecified: Secondary | ICD-10-CM

## 2015-02-25 DIAGNOSIS — O210 Mild hyperemesis gravidarum: Secondary | ICD-10-CM | POA: Insufficient documentation

## 2015-02-25 LAB — URINALYSIS, ROUTINE W REFLEX MICROSCOPIC
Glucose, UA: NEGATIVE mg/dL
Hgb urine dipstick: NEGATIVE
Ketones, ur: 40 mg/dL — AB
Leukocytes, UA: NEGATIVE
Nitrite: NEGATIVE
Protein, ur: 100 mg/dL — AB
Specific Gravity, Urine: 1.02 (ref 1.005–1.030)
Urobilinogen, UA: 2 mg/dL — ABNORMAL HIGH (ref 0.0–1.0)
pH: 6.5 (ref 5.0–8.0)

## 2015-02-25 LAB — URINE MICROSCOPIC-ADD ON

## 2015-02-25 MED ORDER — ONDANSETRON 8 MG PO TBDP: 8.0000 mg | ORAL_TABLET | Freq: Three times a day (TID) | ORAL | Status: DC | PRN

## 2015-02-25 MED ORDER — ONDANSETRON 8 MG/NS 50 ML IVPB
8.0000 mg | Freq: Once | INTRAVENOUS | Status: AC
  Administered 2015-02-25: 8 mg via INTRAVENOUS
  Filled 2015-02-25: qty 8

## 2015-02-25 MED ORDER — DEXTROSE 5 % IN LACTATED RINGERS IV BOLUS
1000.0000 mL | Freq: Once | INTRAVENOUS | Status: AC
  Administered 2015-02-25: 1000 mL via INTRAVENOUS

## 2015-02-25 NOTE — MAU Provider Note (Signed)
History     CSN: [id number]  Arrival date and time: 02/25/15 1307   First Provider Initiated Contact with Patient 02/25/15 1350      Chief Complaint  Patient presents with  . Emesis   HPI  Cindy Hunter is a 24 y.o. a G3P0020 at [id number] who presents today with nausea and vomiting. She states that she has been given phenergan, and it "makes it worse". She states that it causes her to have more nausea and spitting. She is unsure of how many times per day she is vomiting, but states that she "vomits all day long". She denies any abdominal pain or vaginal bleeding. She is planning on terminating this pregnancy and [facility] [place]   History reviewed. No pertinent past medical history.  Past Surgical History  Procedure Laterality Date  . Dilation and curettage of uterus      Family History  Problem Relation Age of Onset  . Hypertension Father   . Hypertension Maternal Grandmother   . Arthritis Maternal Grandmother   . Diabetes Maternal Grandfather   . Hypertension Maternal Grandfather     History  Substance Use Topics  . Smoking status: Never Smoker   . Smokeless tobacco: Never Used  . Alcohol Use: No    Allergies:  Allergies  Allergen Reactions  . Penicillins Hives    Prescriptions prior to admission  Medication Sig Dispense Refill Last Dose  . loratadine (CLARITIN) 10 MG tablet Take 1 tablet (10 mg total) by mouth daily. 14 tablet 0   . Prenatal Vit-Fe Fumarate-FA (PRENATAL VITAMINS) 28-0.8 MG TABS Take 1 tablet by mouth daily. 30 tablet 3   . promethazine (PHENERGAN) 25 MG tablet Take 1 tablet (25 mg total) by mouth every 6 (six) hours as needed for nausea. 12 tablet 0     Review of Systems  Constitutional: Negative for fever and chills.  Eyes: Negative for blurred vision.  Respiratory: Negative for shortness of breath.   Cardiovascular: Negative for chest pain.  Gastrointestinal: Positive for nausea and vomiting. Negative  for abdominal pain, diarrhea and constipation.  Genitourinary: Negative for dysuria, urgency and frequency.  Musculoskeletal: Negative for myalgias.  Neurological: Negative for dizziness and headaches.  All other systems reviewed and are negative.  Physical Exam   Blood pressure 106/69, pulse 105, temperature 97.8 F (36.6 C), temperature source Oral, resp. rate 16, height 5' 1.5" (1.562 m), weight 62.596 kg (138 lb), last menstrual period 12/30/2014.  Physical Exam  Nursing note and vitals reviewed. Constitutional: She is oriented to person, place, and time. She appears well-developed and well-nourished. No distress.  Cardiovascular: Normal rate.   Respiratory: Effort normal.  GI: Soft. There is no tenderness. There is no rebound.  Neurological: She is alert and oriented to person, place, and time.  Skin: Skin is warm and dry.  Psychiatric: She has a normal mood and affect.    MAU Course  Procedures Results for orders placed or performed during the hospital encounter of 02/25/15 (from the past 24 hour(s))  Urinalysis, Routine w reflex microscopic     Status: Abnormal   Collection Time: 02/25/15  1:18 PM  Result Value Ref Range   Color, Urine YELLOW YELLOW   APPearance CLEAR CLEAR   Specific Gravity, Urine 1.020 1.005 - 1.030   pH 6.5 5.0 - 8.0   Glucose, UA NEGATIVE NEGATIVE mg/dL   Hgb urine dipstick NEGATIVE NEGATIVE   Bilirubin Urine SMALL (A) NEGATIVE   Ketones,  ur 40 (A) NEGATIVE mg/dL   Protein, ur 161 (A) NEGATIVE mg/dL   Urobilinogen, UA 2.0 (H) 0.0 - 1.0 mg/dL   Nitrite NEGATIVE NEGATIVE   Leukocytes, UA NEGATIVE NEGATIVE  Urine microscopic-add on     Status: None   Collection Time: 02/25/15  1:18 PM  Result Value Ref Range   Squamous Epithelial / LPF RARE RARE   WBC, UA 0-2 <3 WBC/hpf   Bacteria, UA RARE RARE   Urine-Other MUCOUS PRESENT    1526: patient is tolerating PO at this time.  Assessment and Plan   1. Pregnancy related nausea and vomiting,  antepartum    DC home Rx zofran PRN  Return to MAU as needed  Follow-up Information    Please follow up.   Why:  As scheduled   Contact information:   [facility]        Tawnya Crook 02/25/2015, 1:51 PM

## 2015-02-25 NOTE — MAU Note (Signed)
Ongoing problems with vomiting. Has been given meds, not taking them- "they make her sick"

## 2015-02-25 NOTE — Discharge Instructions (Signed)

## 2015-03-31 ENCOUNTER — Other Ambulatory Visit: Payer: Self-pay | Admitting: Advanced Practice Midwife

## 2015-12-07 ENCOUNTER — Encounter (HOSPITAL_COMMUNITY): Payer: Self-pay | Admitting: *Deleted

## 2016-02-27 ENCOUNTER — Emergency Department (HOSPITAL_BASED_OUTPATIENT_CLINIC_OR_DEPARTMENT_OTHER)
Admission: EM | Admit: 2016-02-27 | Discharge: 2016-02-27 | Disposition: A | Discharge status: Home / Self Care | Payer: Medicaid Other

## 2016-02-27 NOTE — ED Notes (Signed)
Registration reports that pt left prior to triage.

## 2016-03-04 ENCOUNTER — Encounter (HOSPITAL_BASED_OUTPATIENT_CLINIC_OR_DEPARTMENT_OTHER): Payer: Self-pay | Admitting: *Deleted

## 2016-03-04 ENCOUNTER — Emergency Department (HOSPITAL_BASED_OUTPATIENT_CLINIC_OR_DEPARTMENT_OTHER)
Admission: EM | Admit: 2016-03-04 | Discharge: 2016-03-04 | Disposition: A | Discharge status: Home / Self Care | Payer: Medicaid Other | Attending: Emergency Medicine | Admitting: Emergency Medicine

## 2016-03-04 DIAGNOSIS — Z3491 Encounter for supervision of normal pregnancy, unspecified, first trimester: Secondary | ICD-10-CM

## 2016-03-04 DIAGNOSIS — R110 Nausea: Secondary | ICD-10-CM | POA: Diagnosis not present

## 2016-03-04 DIAGNOSIS — Z880 Allergy status to penicillin: Secondary | ICD-10-CM | POA: Diagnosis not present

## 2016-03-04 DIAGNOSIS — Z331 Pregnant state, incidental: Secondary | ICD-10-CM | POA: Insufficient documentation

## 2016-03-04 DIAGNOSIS — R109 Unspecified abdominal pain: Secondary | ICD-10-CM | POA: Diagnosis present

## 2016-03-04 LAB — URINE MICROSCOPIC-ADD ON: RBC / HPF: NONE SEEN RBC/hpf (ref 0–5)

## 2016-03-04 LAB — URINALYSIS, ROUTINE W REFLEX MICROSCOPIC
Bilirubin Urine: NEGATIVE
Glucose, UA: NEGATIVE mg/dL
Hgb urine dipstick: NEGATIVE
Ketones, ur: NEGATIVE mg/dL
Nitrite: NEGATIVE
Protein, ur: NEGATIVE mg/dL
Specific Gravity, Urine: 1.021 (ref 1.005–1.030)
pH: 7 (ref 5.0–8.0)

## 2016-03-04 LAB — PREGNANCY, URINE: Preg Test, Ur: POSITIVE — AB

## 2016-03-04 MED ORDER — GI COCKTAIL ~~LOC~~: 30.0000 mL | Freq: Once | ORAL | Status: DC

## 2016-03-04 MED ORDER — ONDANSETRON 4 MG PO TBDP: 4.0000 mg | ORAL_TABLET | Freq: Once | ORAL | Status: DC

## 2016-03-04 NOTE — ED Notes (Signed)
Presents with abdominal discomfort, feels hungry at all times. Has had some nausea, no vomiting, states had diarrhea this am

## 2016-03-04 NOTE — ED Notes (Signed)
DC instructions reviewed with pt, opportunity for questions provided, encouraged patient to make OB appoint for prenatal care.

## 2016-03-04 NOTE — ED Provider Notes (Signed)
CSN: [id number]     Arrival date & time 03/04/16  [id number] History   First MD Initiated Contact with Patient 03/04/16 0801     Chief Complaint  Patient presents with  . Abdominal Pain     (Consider location/radiation/quality/duration/timing/severity/associated sxs/prior Treatment) Patient is a 25 y.o. female presenting with GI illness. The history is provided by the patient.  GI Problem This is a new problem. The current episode started 2 days ago. The problem occurs constantly. The problem has not changed since onset.Associated symptoms comments: Reflux, nausea, increased hunger. Nothing aggravates the symptoms. Nothing relieves the symptoms. She has tried nothing for the symptoms. The treatment provided no relief.    History reviewed. No pertinent past medical history. Past Surgical History  Procedure Laterality Date  . Dilation and curettage of uterus     Family History  Problem Relation Age of Onset  . Hypertension Father   . Hypertension Maternal Grandmother   . Arthritis Maternal Grandmother   . Diabetes Maternal Grandfather   . Hypertension Maternal Grandfather    Social History  Substance Use Topics  . Smoking status: Never Smoker   . Smokeless tobacco: Never Used  . Alcohol Use: No   OB History    Gravida Para Term Preterm AB TAB SAB Ectopic Multiple Living   3    2 1 1         Review of Systems  All other systems reviewed and are negative.     Allergies  Penicillins and Phenergan  Home Medications   Prior to Admission medications   Medication Sig Start Date End Date Taking? Authorizing Provider  loratadine (CLARITIN) 10 MG tablet Take 1 tablet (10 mg total) by mouth daily. Patient not taking: Reported on 02/25/2015 12/12/13   Janne NapoleonHope M Neese, NP  ondansetron (ZOFRAN ODT) 8 MG disintegrating tablet Take 1 tablet (8 mg total) by mouth every 8 (eight) hours as needed for nausea or vomiting. 02/25/15   Armando ReichertHeather D Hogan, CNM  Prenatal Vit-Fe Fumarate-FA (PRENATAL  VITAMINS) 28-0.8 MG TABS Take 1 tablet by mouth daily. Patient not taking: Reported on 02/25/2015 02/01/15   Duane LopeJennifer I Rasch, NP  promethazine (PHENERGAN) 25 MG tablet Take 1 tablet (25 mg total) by mouth every 6 (six) hours as needed for nausea. 02/15/15   Geoffery Lyonsouglas Delo, MD   BP 110/68 mmHg  Pulse 85  Temp(Src) 98 F (36.7 C) (Oral)  Resp 18  Ht 5\' 2"  (1.575 m)  Wt 177 lb 4.8 oz (80.423 kg)  BMI 32.42 kg/m2  SpO2 100%  LMP 01/31/2016 (Approximate) Physical Exam  Constitutional: She is oriented to person, place, and time. She appears well-developed and well-nourished. No distress.  HENT:  Head: Normocephalic.  Eyes: Conjunctivae are normal.  Neck: Neck supple. No tracheal deviation present.  Cardiovascular: Normal rate and regular rhythm.   Pulmonary/Chest: Effort normal. No respiratory distress.  Abdominal: Soft. She exhibits no distension. There is no tenderness. There is no rebound and no guarding.  Neurological: She is alert and oriented to person, place, and time.  Skin: Skin is warm and dry.  Psychiatric: She has a normal mood and affect.  Vitals reviewed.   ED Course  Procedures (including critical care time) Labs Review Labs Reviewed  PREGNANCY, URINE - Abnormal; Notable for the following:    Preg Test, Ur POSITIVE (*)    All other components within normal limits  URINALYSIS, ROUTINE W REFLEX MICROSCOPIC (NOT AT [facility]) - Abnormal; Notable for the following:    Leukocytes,  UA TRACE (*)    All other components within normal limits  URINE MICROSCOPIC-ADD ON - Abnormal; Notable for the following:    Squamous Epithelial / LPF 0-5 (*)    Bacteria, UA MANY (*)    All other components within normal limits    Imaging Review No results found. I have personally reviewed and evaluated these images and lab results as part of my medical decision-making.   EKG Interpretation None      MDM   Final diagnoses:  First trimester pregnancy    25 y.o. female presents with  Increased hunger and nausea without vomiting. Had a loose bowel movement this morning. She has a positive pregnancy test today. Referred for outpatient follow-up with OB/GYN and recommended stay prenatal vitamins.    Lyndal Pulley, MD 03/04/16 [phone]

## 2016-03-04 NOTE — Discharge Instructions (Signed)
First Trimester of Pregnancy The first trimester of pregnancy is from week 1 until the end of week 12 (months 1 through 3). A week after a sperm fertilizes an egg, the egg will implant on the wall of the uterus. This embryo will begin to develop into a baby. Genes from you and your partner are forming the baby. The female genes determine whether the baby is a boy or a girl. At 6-8 weeks, the eyes and face are formed, and the heartbeat can be seen on ultrasound. At the end of 12 weeks, all the baby's organs are formed.  Now that you are pregnant, you will want to do everything you can to have a healthy baby. Two of the most important things are to get good prenatal care and to follow your health care provider's instructions. Prenatal care is all the medical care you receive before the baby's birth. This care will help prevent, find, and treat any problems during the pregnancy and childbirth. BODY CHANGES Your body goes through many changes during pregnancy. The changes vary from woman to woman.   You may gain or lose a couple of pounds at first.  You may feel sick to your stomach (nauseous) and throw up (vomit). If the vomiting is uncontrollable, call your health care provider.  You may tire easily.  You may develop headaches that can be relieved by medicines approved by your health care provider.  You may urinate more often. Painful urination may mean you have a bladder infection.  You may develop heartburn as a result of your pregnancy.  You may develop constipation because certain hormones are causing the muscles that push waste through your intestines to slow down.  You may develop hemorrhoids or swollen, bulging veins (varicose veins).  Your breasts may begin to grow larger and become tender. Your nipples may stick out more, and the tissue that surrounds them (areola) may become darker.  Your gums may bleed and may be sensitive to brushing and flossing.  Dark spots or blotches (chloasma,  mask of pregnancy) may develop on your face. This will likely fade after the baby is born.  Your menstrual periods will stop.  You may have a loss of appetite.  You may develop cravings for certain kinds of food.  You may have changes in your emotions from day to day, such as being excited to be pregnant or being concerned that something may go wrong with the pregnancy and baby.  You may have more vivid and strange dreams.  You may have changes in your hair. These can include thickening of your hair, rapid growth, and changes in texture. Some women also have hair loss during or after pregnancy, or hair that feels dry or thin. Your hair will most likely return to normal after your baby is born. WHAT TO EXPECT AT YOUR PRENATAL VISITS During a routine prenatal visit:  You will be weighed to make sure you and the baby are growing normally.  Your blood pressure will be taken.  Your abdomen will be measured to track your baby's growth.  The fetal heartbeat will be listened to starting around week 10 or 12 of your pregnancy.  Test results from any previous visits will be discussed. Your health care provider may ask you:  How you are feeling.  If you are feeling the baby move.  If you have had any abnormal symptoms, such as leaking fluid, bleeding, severe headaches, or abdominal cramping.  If you are using any tobacco products,   including cigarettes, chewing tobacco, and electronic cigarettes.  If you have any questions. Other tests that may be performed during your first trimester include:  Blood tests to find your blood type and to check for the presence of any previous infections. They will also be used to check for low iron levels (anemia) and Rh antibodies. Later in the pregnancy, blood tests for diabetes will be done along with other tests if problems develop.  Urine tests to check for infections, diabetes, or protein in the urine.  An ultrasound to confirm the proper growth  and development of the baby.  An amniocentesis to check for possible genetic problems.  Fetal screens for spina bifida and Down syndrome.  You may need other tests to make sure you and the baby are doing well.  HIV (human immunodeficiency virus) testing. Routine prenatal testing includes screening for HIV, unless you choose not to have this test. HOME CARE INSTRUCTIONS  Medicines  Follow your health care provider's instructions regarding medicine use. Specific medicines may be either safe or unsafe to take during pregnancy.  Take your prenatal vitamins as directed.  If you develop constipation, try taking a stool softener if your health care provider approves. Diet  Eat regular, well-balanced meals. Choose a variety of foods, such as meat or vegetable-based protein, fish, milk and low-fat dairy products, vegetables, fruits, and whole grain breads and cereals. Your health care provider will help you determine the amount of weight gain that is right for you.  Avoid raw meat and uncooked cheese. These carry germs that can cause birth defects in the baby.  Eating four or five small meals rather than three large meals a day may help relieve nausea and vomiting. If you start to feel nauseous, eating a few soda crackers can be helpful. Drinking liquids between meals instead of during meals also seems to help nausea and vomiting.  If you develop constipation, eat more high-fiber foods, such as fresh vegetables or fruit and whole grains. Drink enough fluids to keep your urine clear or pale yellow. Activity and Exercise  Exercise only as directed by your health care provider. Exercising will help you:  Control your weight.  Stay in shape.  Be prepared for labor and delivery.  Experiencing pain or cramping in the lower abdomen or low back is a good sign that you should stop exercising. Check with your health care provider before continuing normal exercises.  Try to avoid standing for long  periods of time. Move your legs often if you must stand in one place for a long time.  Avoid heavy lifting.  Wear low-heeled shoes, and practice good posture.  You may continue to have sex unless your health care provider directs you otherwise. Relief of Pain or Discomfort  Wear a good support bra for breast tenderness.   Take warm sitz baths to soothe any pain or discomfort caused by hemorrhoids. Use hemorrhoid cream if your health care provider approves.   Rest with your legs elevated if you have leg cramps or low back pain.  If you develop varicose veins in your legs, wear support hose. Elevate your feet for 15 minutes, 3-4 times a day. Limit salt in your diet. Prenatal Care  Schedule your prenatal visits by the twelfth week of pregnancy. They are usually scheduled monthly at first, then more often in the last 2 months before delivery.  Write down your questions. Take them to your prenatal visits.  Keep all your prenatal visits as directed by your   health care provider. Safety  Wear your seat belt at all times when driving.  Make a list of emergency phone numbers, including numbers for family, friends, the hospital, and police and fire departments. General Tips  Ask your health care provider for a referral to a local prenatal education class. Begin classes no later than at the beginning of month 6 of your pregnancy.  Ask for help if you have counseling or nutritional needs during pregnancy. Your health care provider can offer advice or refer you to specialists for help with various needs.  Do not use hot tubs, steam rooms, or saunas.  Do not douche or use tampons or scented sanitary pads.  Do not cross your legs for long periods of time.  Avoid cat litter boxes and soil used by cats. These carry germs that can cause birth defects in the baby and possibly loss of the fetus by miscarriage or stillbirth.  Avoid all smoking, herbs, alcohol, and medicines not prescribed by  your health care provider. Chemicals in these affect the formation and growth of the baby.  Do not use any tobacco products, including cigarettes, chewing tobacco, and electronic cigarettes. If you need help quitting, ask your health care provider. You may receive counseling support and other resources to help you quit.  Schedule a dentist appointment. At home, brush your teeth with a soft toothbrush and be gentle when you floss. SEEK MEDICAL CARE IF:   You have dizziness.  You have mild pelvic cramps, pelvic pressure, or nagging pain in the abdominal area.  You have persistent nausea, vomiting, or diarrhea.  You have a bad smelling vaginal discharge.  You have pain with urination.  You notice increased swelling in your face, hands, legs, or ankles. SEEK IMMEDIATE MEDICAL CARE IF:   You have a fever.  You are leaking fluid from your vagina.  You have spotting or bleeding from your vagina.  You have severe abdominal cramping or pain.  You have rapid weight gain or loss.  You vomit blood or material that looks like coffee grounds.  You are exposed to German measles and have never had them.  You are exposed to fifth disease or chickenpox.  You develop a severe headache.  You have shortness of breath.  You have any kind of trauma, such as from a fall or a car accident.   This information is not intended to replace advice given to you by your health care provider. Make sure you discuss any questions you have with your health care provider.   Document Released: 11/21/2001 Document Revised: 12/18/2014 Document Reviewed: 10/07/2013 Elsevier Interactive Patient Education 2016 [employer]  

## 2016-03-21 ENCOUNTER — Emergency Department (HOSPITAL_BASED_OUTPATIENT_CLINIC_OR_DEPARTMENT_OTHER)
Admission: EM | Admit: 2016-03-21 | Discharge: 2016-03-21 | Discharge status: Against Medical Advice | Payer: Medicaid Other | Attending: Emergency Medicine | Admitting: Emergency Medicine

## 2016-03-21 ENCOUNTER — Encounter (HOSPITAL_BASED_OUTPATIENT_CLINIC_OR_DEPARTMENT_OTHER): Payer: Self-pay | Admitting: *Deleted

## 2016-03-21 DIAGNOSIS — Z880 Allergy status to penicillin: Secondary | ICD-10-CM | POA: Insufficient documentation

## 2016-03-21 DIAGNOSIS — N898 Other specified noninflammatory disorders of vagina: Secondary | ICD-10-CM | POA: Insufficient documentation

## 2016-03-21 DIAGNOSIS — O99719 Diseases of the skin and subcutaneous tissue complicating pregnancy, unspecified trimester: Secondary | ICD-10-CM | POA: Insufficient documentation

## 2016-03-21 DIAGNOSIS — R300 Dysuria: Secondary | ICD-10-CM | POA: Insufficient documentation

## 2016-03-21 DIAGNOSIS — L299 Pruritus, unspecified: Secondary | ICD-10-CM | POA: Diagnosis not present

## 2016-03-21 DIAGNOSIS — R319 Hematuria, unspecified: Secondary | ICD-10-CM | POA: Diagnosis not present

## 2016-03-21 DIAGNOSIS — O9989 Other specified diseases and conditions complicating pregnancy, childbirth and the puerperium: Secondary | ICD-10-CM | POA: Insufficient documentation

## 2016-03-21 DIAGNOSIS — O219 Vomiting of pregnancy, unspecified: Secondary | ICD-10-CM | POA: Diagnosis not present

## 2016-03-21 LAB — URINE MICROSCOPIC-ADD ON

## 2016-03-21 LAB — URINALYSIS, ROUTINE W REFLEX MICROSCOPIC
Bilirubin Urine: NEGATIVE
Glucose, UA: NEGATIVE mg/dL
Hgb urine dipstick: NEGATIVE
Ketones, ur: NEGATIVE mg/dL
Nitrite: NEGATIVE
Protein, ur: NEGATIVE mg/dL
Specific Gravity, Urine: 1.026 (ref 1.005–1.030)
pH: 6 (ref 5.0–8.0)

## 2016-03-21 LAB — PREGNANCY, URINE: Preg Test, Ur: POSITIVE — AB

## 2016-03-21 NOTE — ED Notes (Signed)
Pt c/o white d/c x 2 weeks. Denies itching. Positive preg  test here 4 weeks ago per pt. G4P1. Abortion x 1 Miscarriage x 1

## 2016-03-21 NOTE — ED Notes (Signed)
Pt. Reports she was not waiting any longer.  Pt. Said she was told the wait was only 30 minutes RN explained to Pt. If she leaves she is signing out AMA.  Pt. Said she is leaving and will sign "whatever" .  Pt. Stated "I will come back tomorrow".

## 2016-03-21 NOTE — ED Provider Notes (Signed)
CSN: [id number]     Arrival date & time 03/21/16  2004 History   First MD Initiated Contact with Patient 03/21/16 2043     Chief Complaint  Patient presents with  . Vaginal Discharge   Patient is a 25 y.o. female presenting with vaginal discharge.  Vaginal Discharge Quality:  White and thick Severity:  Mild Duration:  2 weeks Timing:  Intermittent Progression:  Unchanged Ineffective treatments:  None tried Associated symptoms: no abdominal pain, no dyspareunia, no dysuria, no fever, no genital lesions, no nausea, no rash, no urinary frequency, no vaginal itching and no vomiting   Risk factors: unprotected sex    [name] is a 25 year old female presenting with vaginal discharge. She reports onset of symptoms was 2 weeks ago. She complains of intermittent white, thick discharge. She denies associated dysuria, hematuria, pelvic pain, pruritus, abdominal pain, nausea or vomiting. She reports that she is pregnant but she is unsure how far along she is. She does have an OB/GYN but has not scheduled a prenatal visit yet. Denies concern for STD exposure.  History reviewed. No pertinent past medical history. Past Surgical History  Procedure Laterality Date  . Dilation and curettage of uterus     Family History  Problem Relation Age of Onset  . Hypertension Father   . Hypertension Maternal Grandmother   . Arthritis Maternal Grandmother   . Diabetes Maternal Grandfather   . Hypertension Maternal Grandfather    Social History  Substance Use Topics  . Smoking status: Never Smoker   . Smokeless tobacco: Never Used  . Alcohol Use: No   OB History    Gravida Para Term Preterm AB TAB SAB Ectopic Multiple Living   Review of Systems  Constitutional: Negative for fever.  Gastrointestinal: Negative for nausea, vomiting and abdominal pain.  Genitourinary: Positive for vaginal discharge. Negative for dysuria and dyspareunia.  All other systems reviewed and are  negative.     Allergies  Penicillins and Phenergan  Home Medications   Prior to Admission medications   Medication Sig Start Date End Date Taking? Authorizing Provider  loratadine (CLARITIN) 10 MG tablet Take 1 tablet (10 mg total) by mouth daily. Patient not taking: Reported on 02/25/2015 12/12/13   Janne Napoleon, NP  ondansetron (ZOFRAN ODT) 8 MG disintegrating tablet Take 1 tablet (8 mg total) by mouth every 8 (eight) hours as needed for nausea or vomiting. 02/25/15   Armando Reichert, CNM  Prenatal Vit-Fe Fumarate-FA (PRENATAL VITAMINS) 28-0.8 MG TABS Take 1 tablet by mouth daily. Patient not taking: Reported on 02/25/2015 02/01/15   Duane Lope, NP   BP 137/79 mmHg  Pulse 97  Temp(Src) 98.3 F (36.8 C) (Oral)  Resp 18  Ht  (1.6 m)  Wt 90.719 kg  BMI 35.44 kg/m2  SpO2 100%  LMP 01/31/2016 (Approximate)  Breastfeeding? No Physical Exam  Constitutional: She appears well-developed and well-nourished. No distress.  Nontoxic-appearing  HENT:  Head: Normocephalic and atraumatic.  Right Ear: External ear normal.  Left Ear: External ear normal.  Eyes: Conjunctivae are normal. Right eye exhibits no discharge. Left eye exhibits no discharge. No scleral icterus.  Neck: Normal range of motion.  Cardiovascular: Normal rate.   Pulmonary/Chest: Effort normal.  Abdominal: Soft. She exhibits no distension. There is no tenderness.  Musculoskeletal: Normal range of motion.  Moves all extremities spontaneously  Neurological: She is alert. Coordination normal.  Skin: Skin  is warm and dry.  Psychiatric: She has a normal mood and affect. Her behavior is normal.  Nursing note and vitals reviewed.   ED Course  Procedures (including critical care time) Labs Review Labs Reviewed  URINALYSIS, ROUTINE W REFLEX MICROSCOPIC (NOT AT [facility]) - Abnormal; Notable for the following:    Leukocytes, UA MODERATE (*)    All other components within normal limits  PREGNANCY, URINE - Abnormal;  Notable for the following:    Preg Test, Ur POSITIVE (*)    All other components within normal limits  URINE MICROSCOPIC-ADD ON - Abnormal; Notable for the following:    Squamous Epithelial / LPF 6-30 (*)    Bacteria, UA FEW (*)    All other components within normal limits  WET PREP, GENITAL  GC/CHLAMYDIA PROBE AMP ([place]) NOT AT [facility]    Imaging Review No results found. I have personally reviewed and evaluated these images and lab results as part of my medical decision-making.   EKG Interpretation None      MDM   Final diagnoses:  Vaginal discharge   25 year old female presenting with vaginal discharge 2 weeks. Afebrile, hemodynamically stable and nontoxic appearing. Abdomen soft, nontender without peritoneal signs. Discussed performing pelvic exam which patient agreed to. Nurse informed me shortly thereafter that patient has left AMA. Patient was stating that she thought she would only be here for 30 minutes and would not wait any longer. I did not have the chance to discuss this with the patient prior to her eloping. Patient is AMA discharge.    Rolm GalaStevi Quianna Avery, PA-C 03/21/16 2313  Geoffery Lyonsouglas Delo, MD 03/21/16 [phone]

## 2016-03-21 NOTE — ED Notes (Signed)
Pt reports white vaginal discharge x 2 weeks. denies pain.  Reports that she is pregnant but does not know how far along that she is.  LMP: Feb 20.

## 2017-03-03 ENCOUNTER — Encounter (HOSPITAL_BASED_OUTPATIENT_CLINIC_OR_DEPARTMENT_OTHER): Payer: Self-pay | Admitting: Emergency Medicine

## 2017-03-03 ENCOUNTER — Emergency Department (HOSPITAL_BASED_OUTPATIENT_CLINIC_OR_DEPARTMENT_OTHER)
Admission: EM | Admit: 2017-03-03 | Discharge: 2017-03-03 | Disposition: A | Discharge status: Home / Self Care | Payer: Medicaid Other | Attending: Emergency Medicine | Admitting: Emergency Medicine

## 2017-03-03 DIAGNOSIS — N898 Other specified noninflammatory disorders of vagina: Secondary | ICD-10-CM | POA: Diagnosis present

## 2017-03-03 DIAGNOSIS — N760 Acute vaginitis: Secondary | ICD-10-CM | POA: Insufficient documentation

## 2017-03-03 DIAGNOSIS — B9689 Other specified bacterial agents as the cause of diseases classified elsewhere: Secondary | ICD-10-CM

## 2017-03-03 LAB — URINALYSIS, ROUTINE W REFLEX MICROSCOPIC
Bilirubin Urine: NEGATIVE
Glucose, UA: NEGATIVE mg/dL
Hgb urine dipstick: NEGATIVE
Ketones, ur: NEGATIVE mg/dL
Leukocytes, UA: NEGATIVE
Nitrite: NEGATIVE
Protein, ur: NEGATIVE mg/dL
Specific Gravity, Urine: 1.03 (ref 1.005–1.030)
pH: 6 (ref 5.0–8.0)

## 2017-03-03 LAB — WET PREP, GENITAL
Sperm: NONE SEEN
Trich, Wet Prep: NONE SEEN
WBC, Wet Prep HPF POC: NONE SEEN
Yeast Wet Prep HPF POC: NONE SEEN

## 2017-03-03 LAB — PREGNANCY, URINE: Preg Test, Ur: NEGATIVE

## 2017-03-03 MED ORDER — METRONIDAZOLE 500 MG PO TABS: 500.0000 mg | ORAL_TABLET | Freq: Two times a day (BID) | ORAL | 0 refills | Status: DC

## 2017-03-03 NOTE — ED Provider Notes (Signed)
[facility] Provider Note   CSN: [id number] Arrival date & time: 03/03/17  1614  By signing my name below, I, Cindy Hunter, attest that this documentation has been prepared under the direction and in the presence of  Demetrios Loll, PA-C. Electronically Signed: Doreatha Hunter, ED Scribe. 03/03/17. 4:54 PM.   History   Chief Complaint Chief Complaint  Patient presents with  . Vaginal Discharge    HPI Cindy Hunter is a 26 y.o. female who presents to the Emergency Department complaining of intermittent malodorous brown vaginal discharge that began 2 weeks ago. Pt states she notices the discharge on her underwear and after showering, and describes the odor as "fishy". No worsening or alleviating factors noted. No h/o of similar symptoms. Pt is not currently sexually active. No recent antibiotic use or OTC yeast infection treatments. Pt is not concerned for pregnancy or STDs. She does not currently have periods d/t her birth control method, Depo Provera. Pt denies vaginal itching or pain, dysuria, fever, nausea, vomiting, abdominal pain, hematuria, vaginal bleeding.    The history is provided by the patient. No language interpreter was used.    History reviewed. No pertinent past medical history.  There are no active problems to display for this patient.   Past Surgical History:  Procedure Laterality Date  . DILATION AND CURETTAGE OF UTERUS    . WISDOM TOOTH EXTRACTION      OB History    Gravida Para Term Preterm AB Living   4       2     SAB TAB Ectopic Multiple Live Births   1 1             Home Medications    Prior to Admission medications   Medication Sig Start Date End Date Taking? Authorizing Provider  loratadine (CLARITIN) 10 MG tablet Take 1 tablet (10 mg total) by mouth daily. Patient not taking: Reported on 02/25/2015 12/12/13   Janne Napoleon, NP  ondansetron (ZOFRAN ODT) 8 MG disintegrating tablet Take 1 tablet (8 mg total) by mouth every 8 (eight) hours  as needed for nausea or vomiting. 02/25/15   Armando Reichert, CNM  Prenatal Vit-Fe Fumarate-FA (PRENATAL VITAMINS) 28-0.8 MG TABS Take 1 tablet by mouth daily. Patient not taking: Reported on 02/25/2015 02/01/15   Duane Lope, NP    Family History Family History  Problem Relation Age of Onset  . Hypertension Father   . Hypertension Maternal Grandmother   . Arthritis Maternal Grandmother   . Diabetes Maternal Grandfather   . Hypertension Maternal Grandfather     Social History Social History  Substance Use Topics  . Smoking status: Never Smoker  . Smokeless tobacco: Never Used  . Alcohol use No     Allergies   Penicillins and Phenergan [promethazine hcl]   Review of Systems Review of Systems  Constitutional: Negative for fever.  Gastrointestinal: Negative for abdominal pain, nausea and vomiting.  Genitourinary: Positive for vaginal discharge. Negative for dysuria, hematuria, vaginal bleeding and vaginal pain.  All other systems reviewed and are negative.   Physical Exam Updated Vital Signs BP 118/80 (BP Location: Left Arm)   Pulse 94   Temp 99 F (37.2 C) (Oral)   Resp 19   Ht 5\' 2"  (1.575 m)   Wt 180 lb (81.6 kg)   SpO2 100%   Breastfeeding? Unknown   BMI 32.92 kg/m   Physical Exam  Constitutional: She appears well-developed and well-nourished.  HENT:  Head: Normocephalic.  Eyes: Conjunctivae are normal.  Cardiovascular: Normal rate.   Pulmonary/Chest: Effort normal. No respiratory distress.  Abdominal: Soft. Bowel sounds are normal. She exhibits no distension. There is no tenderness. There is no rebound and no guarding.  Genitourinary: There is no rash, tenderness or lesion on the right labia. There is no rash, tenderness or lesion on the left labia. Cervix exhibits no motion tenderness, no discharge and no friability. Right adnexum displays no mass, no tenderness and no fullness. Left adnexum displays no mass, no tenderness and no fullness. No erythema,  tenderness or bleeding in the vagina. Vaginal discharge (thin white malodorous discharge) found.  Genitourinary Comments: Chaperone present throughout entire exam.    Musculoskeletal: Normal range of motion.  Neurological: She is alert.  Skin: Skin is warm and dry.  Psychiatric: She has a normal mood and affect. Her behavior is normal.  Nursing note and vitals reviewed.    ED Treatments / Results   DIAGNOSTIC STUDIES: Oxygen Saturation is 100% on RA, normal by my interpretation.    COORDINATION OF CARE: 4:50 PM Discussed treatment plan with pt at bedside which includes UA, urine preg and pt agreed to plan.    Labs (all labs ordered are listed, but only abnormal results are displayed) Labs Reviewed  WET PREP, GENITAL - Abnormal; Notable for the following:       Result Value   Clue Cells Wet Prep HPF POC PRESENT (*)    All other components within normal limits  URINALYSIS, ROUTINE W REFLEX MICROSCOPIC - Abnormal; Notable for the following:    APPearance CLOUDY (*)    All other components within normal limits  PREGNANCY, URINE  GC/CHLAMYDIA PROBE AMP ([place]) NOT AT [facility]    Procedures Procedures (including critical care time)  Medications Ordered in ED Medications - No data to display   Initial Impression / Assessment and Plan / ED Course  I have reviewed the triage vital signs and the nursing notes.  Pertinent lab results that were available during my care of the patient were reviewed by me and considered in my medical decision making (see chart for details).     Patient presents to the ED with malodorous discharge for the past 2 weeks. States she is not sexually active and has no concern for STD. She denies any associated symptoms including abdominal pain, nausea, emesis, urinary symptoms, fever. UA shows no signs of infection.  Prevacid test is negative. Gonorrhea and chlamydia probe is pending. Patient with clue cells and fishy odor and moderate discharge, cervix  normal. Pt diagnosed with BV. Patient given flagyl and instructed to not drink alcohol while taking it. Patient also tested for other STDs. Wet prep with minimal WBC, patient without abdominal pain, CMT or adnexal tenderness and cervix normal. I doubt PID.  The patient is nontoxic appearing. She is in no acute distress. Vital signs are stable. Patient has been encouraged to follow up with PCP or return to the ED if she develops any worsening symptoms. Patient verbalized understanding the plan of care and all questions were answered prior to discharge.  Final Clinical Impressions(s) / ED Diagnoses   Final diagnoses:  BV (bacterial vaginosis)    New Prescriptions Discharge Medication List as of 03/03/2017  5:36 PM    START taking these medications   Details  metroNIDAZOLE (FLAGYL) 500 MG tablet Take 1 tablet (500 mg total) by mouth 2 (two) times daily with a meal. DO NOT CONSUME ALCOHOL WHILE TAKING THIS MEDICATION., Starting Sat 03/03/2017,  Print        I personally performed the services described in this documentation, which was scribed in my presence. The recorded information has been reviewed and is accurate.    Rise Mu, PA-C 03/04/17 1659    Geoffery Lyons, MD 03/04/17 [phone]

## 2017-03-03 NOTE — ED Triage Notes (Signed)
Pt reports malodorous brown vaginal discharge for past 2 weeks.  Denies itching or pain.

## 2017-03-03 NOTE — Discharge Instructions (Signed)
Your studies show signs of bacterial vaginosis. This can be treated with antibiotics. Please take as prescribed do not take alcohol with this medication. Your gonorrhea and chlamydia cultures are pending. We'll be notified if these are positive and treated accordingly. Please follow-up with her OB/GYN doctor in 4-5 days if symptoms are not improving. Return to the ED her symptoms worsen.

## 2017-03-03 NOTE — ED Notes (Signed)
ED Provider at bedside. 

## 2017-03-03 NOTE — ED Notes (Signed)
Patient c/o dark brown vaginal discharge x2 weeks. Patient also reports urinary frequency. Patient is unsure last menstrual cycle as she is on "the depo shot". Patient is A&Ox4, NAD noted. Patient denies pain.

## 2017-03-05 LAB — GC/CHLAMYDIA PROBE AMP (~~LOC~~) NOT AT ARMC
Chlamydia: NEGATIVE
Neisseria Gonorrhea: NEGATIVE

## 2017-06-03 ENCOUNTER — Emergency Department (HOSPITAL_BASED_OUTPATIENT_CLINIC_OR_DEPARTMENT_OTHER)
Admission: EM | Admit: 2017-06-03 | Discharge: 2017-06-03 | Discharge status: Against Medical Advice | Payer: Medicaid Other | Attending: Emergency Medicine | Admitting: Emergency Medicine

## 2017-06-03 ENCOUNTER — Encounter (HOSPITAL_BASED_OUTPATIENT_CLINIC_OR_DEPARTMENT_OTHER): Payer: Self-pay | Admitting: Emergency Medicine

## 2017-06-03 DIAGNOSIS — Z5321 Procedure and treatment not carried out due to patient leaving prior to being seen by health care provider: Secondary | ICD-10-CM | POA: Diagnosis not present

## 2017-06-03 DIAGNOSIS — J029 Acute pharyngitis, unspecified: Secondary | ICD-10-CM | POA: Insufficient documentation

## 2017-06-03 LAB — RAPID STREP SCREEN (MED CTR MEBANE ONLY): Streptococcus, Group A Screen (Direct): NEGATIVE

## 2017-06-03 NOTE — ED Triage Notes (Signed)
Sore throat since yesterday.

## 2017-06-03 NOTE — ED Notes (Signed)
Report received from Georges LynchKeli C., RN. Pt not in room. Belongings not in room.

## 2017-06-05 LAB — CULTURE, GROUP A STREP (THRC)

## 2017-06-06 ENCOUNTER — Telehealth: Payer: Self-pay | Admitting: Emergency Medicine

## 2017-06-06 NOTE — Telephone Encounter (Signed)
Post ED Visit - Positive Culture Follow-up: Successful Patient Follow-Up  Culture assessed and recommendations reviewed by: []  Cindy Hunter, Pharm.D. []  Cindy Hunter, [address] BCPS AQ-ID []  Cindy Hunter, Pharm.D., BCPS [x]  Cindy Hunter, Pharm.D., BCPS []  Cindy Hunter, [place] BCPS, AAHIVP []  Cindy Hunter, Pharm.D., BCPS, AAHIVP []  Cindy Hunter, PharmD, BCPS []  Cindy Hunter, PharmD, BCPS []  Cindy Hunter, PharmD, BCPS  Positive strep culture  [x]  Patient discharged without antimicrobial prescription and treatment is now indicated []  Organism is resistant to prescribed ED discharge antimicrobial []  Patient with positive blood cultures  Changes discussed with ED provider: [name] New antibiotic prescription start Azithromycin 500mg  po daily x 5 days [facility] [address] [name] Contacted patient, 06/06/17 1504   Cindy Hunter, Cindy Hunter 06/06/2017, 3:02 PM

## 2017-06-06 NOTE — Progress Notes (Signed)
ED Antimicrobial Stewardship Positive Culture Follow Up   Cindy Hunter is an 26 y.o. female who [facility] with a chief complaint of  Chief Complaint  Patient presents with  . Sore Throat    Recent Results (from the past 720 hour(s))  Rapid strep screen     Status: None   Collection Time: 06/03/17  5:42 PM  Result Value Ref Range Status   Streptococcus, Group A Screen (Direct) NEGATIVE NEGATIVE Final    Comment: (NOTE) A Rapid Antigen test may result negative if the antigen level in the sample is below the detection level of this test. The FDA has not cleared this test as a stand-alone test therefore the rapid antigen negative result has reflexed to a Group A Strep culture.   Culture, group A strep     Status: None   Collection Time: 06/03/17  5:42 PM  Result Value Ref Range Status   Specimen Description THROAT  Final   Special Requests NONE Reflexed from [id number]  Final   Culture MODERATE GROUP A STREP (S.PYOGENES) ISOLATED  Final   Report Status 06/05/2017 FINAL  Final    [x]  Patient discharged originally without antimicrobial agent and treatment is now indicated  Call for symptom check -  If sore throat still persists >> Azithromycin 500 mg po daily x 5 days If improving/resolving >> No treatment needed  ED Provider: Azucena Kubayler Leaphart, PA-C  Rolley SimsMartin, Krisann Mckenna Ann 06/06/2017, 8:22 AM Infectious Diseases Pharmacist Phone# [phone]

## 2017-09-03 ENCOUNTER — Emergency Department (HOSPITAL_BASED_OUTPATIENT_CLINIC_OR_DEPARTMENT_OTHER)
Admission: EM | Admit: 2017-09-03 | Discharge: 2017-09-03 | Disposition: A | Discharge status: Home / Self Care | Payer: Self-pay | Attending: Emergency Medicine | Admitting: Emergency Medicine

## 2017-09-03 ENCOUNTER — Encounter (HOSPITAL_BASED_OUTPATIENT_CLINIC_OR_DEPARTMENT_OTHER): Payer: Self-pay | Admitting: Emergency Medicine

## 2017-09-03 DIAGNOSIS — B9689 Other specified bacterial agents as the cause of diseases classified elsewhere: Secondary | ICD-10-CM

## 2017-09-03 DIAGNOSIS — N760 Acute vaginitis: Secondary | ICD-10-CM | POA: Insufficient documentation

## 2017-09-03 LAB — URINALYSIS, ROUTINE W REFLEX MICROSCOPIC
Bilirubin Urine: NEGATIVE
Glucose, UA: NEGATIVE mg/dL
Hgb urine dipstick: NEGATIVE
Ketones, ur: NEGATIVE mg/dL
Leukocytes, UA: NEGATIVE
Nitrite: NEGATIVE
Protein, ur: NEGATIVE mg/dL
Specific Gravity, Urine: >1.03 — ABNORMAL HIGH (ref 1.005–1.030)
pH: 6 (ref 5.0–8.0)

## 2017-09-03 LAB — WET PREP, GENITAL
Sperm: NONE SEEN
Trich, Wet Prep: NONE SEEN
Yeast Wet Prep HPF POC: NONE SEEN

## 2017-09-03 LAB — PREGNANCY, URINE: Preg Test, Ur: NEGATIVE

## 2017-09-03 MED ORDER — IBUPROFEN 600 MG PO TABS: 600.0000 mg | ORAL_TABLET | Freq: Four times a day (QID) | ORAL | 0 refills | Status: DC | PRN

## 2017-09-03 MED ORDER — METRONIDAZOLE 500 MG PO TABS: 500.0000 mg | ORAL_TABLET | Freq: Two times a day (BID) | ORAL | 0 refills | Status: DC

## 2017-09-03 NOTE — ED Triage Notes (Signed)
Pt states she has been having back pain for months.  Vaginal discharge for over two weeks.  No known fever.  No dysuria.

## 2017-09-03 NOTE — Discharge Instructions (Addendum)
Please see the information and instructions below regarding your visit.  Your diagnoses today include:  1. BV (bacterial vaginosis)    This is caused by an overgrowth of normal bacteria in your vagina. It can be caused by many reasons including changes in detergent, washes.  Tests performed today include: See side panel of your discharge paperwork for testing performed today. Vital signs are listed at the bottom of these instructions.   Medications prescribed:   1. Flagyl. Please do not take this medication with alcohol, as it can make you feel sick. Take any prescribed medications only as prescribed, and any over the counter medications only as directed on the packaging.  Home care instructions:  Please follow any educational materials contained in this packet.  Follow-up instructions: Please follow-up with [facility] and wellness for further evaluation of your symptoms if they are not completely improved.   Return instructions:  Please return to the Emergency Department if you experience worsening symptoms.  Please return for any worsening discharge, lower abdominal pain, severe nausea and vomiting, fever, or chills. For your back pain, please return if you have numbness or weakness in her legs, numbness in the area where he weight, retention of urine, loss of bowel or bladder control. Please return if you have any other emergent concerns.  Additional Information:   Your vital signs today were: BP 112/75    Pulse 85    Temp 98.3 F (36.8 C)    Resp 18    Ht  (1.575 m)    Wt 81.6 kg (180 lb)    LMP 07/23/2017    SpO2 99%    BMI 32.92 kg/m  If your blood pressure (BP) was elevated on multiple readings during this visit above 130 for the top number or above 80 for the bottom number, please have this repeated by your primary care provider within one month. --------------  Thank you for allowing [place] to participate in your care today.

## 2017-09-03 NOTE — ED Provider Notes (Signed)
[facility] Provider Note   CSN: [id number] Arrival date & time: 09/03/17  1001     History   Chief Complaint Chief Complaint  Patient presents with  . Vaginal Discharge    HPI Cindy Hunter is a 26 y.o. female.  HPI  Patient is a 26 y.o. female with no significant past medical history presenting for 2-3 weeks of malodorous vaginal discharge. Patient reports that it is yellow and increased in quantity. Patient reports that she is not concerned for STI exposure at this time. Patient denies any abnormal vaginal bleeding. Patient denies dysuria, urgency, frequency, flank pain, lower abdominal pain, nausea, vomiting, fever, or chills. Patient's last menstrual period was the end of August. Additionally, patient is reporting one to 2 months of lower bilateral back pain. Patient reports that back pain is a 9 out of 10 and aching in quality when at its worst. Patient reports it occurs at the end of the day. Patient works at [employer] and is frequently lifting heavy boxes. No specific injury type of his back pain. Patient denies any numbness or tingling in lower extremities, weakness, saddle anesthesia, urinary retention, loss of bowel or bladder control, history of cancer, or IV drug use.  No past medical history on file.  There are no active problems to display for this patient.   Past Surgical History:  Procedure Laterality Date  . DILATION AND CURETTAGE OF UTERUS    . WISDOM TOOTH EXTRACTION      OB History    Gravida Para Term Preterm AB Living   4       2     SAB TAB Ectopic Multiple Live Births   1 1             Home Medications    Prior to Admission medications   Not on File    Family History Family History  Problem Relation Age of Onset  . Hypertension Father   . Hypertension Maternal Grandmother   . Arthritis Maternal Grandmother   . Diabetes Maternal Grandfather   . Hypertension Maternal Grandfather     Social History Social History    Substance Use Topics  . Smoking status: Never Smoker  . Smokeless tobacco: Never Used  . Alcohol use No     Allergies   Penicillins and Phenergan [promethazine hcl]   Review of Systems Review of Systems  Constitutional: Negative for chills and fever.  HENT: Negative for sore throat.   Eyes: Negative for visual disturbance.  Respiratory: Negative for cough.   Cardiovascular: Negative for chest pain.  Gastrointestinal: Negative for abdominal pain, nausea and vomiting.  Genitourinary: Negative for dysuria, genital sores, pelvic pain, urgency, vaginal bleeding, vaginal discharge and vaginal pain.       + vaginal discharge  Musculoskeletal: Positive for back pain.  Skin: Negative for rash.  Neurological: Positive for headaches. Negative for weakness and numbness.     Physical Exam Updated Vital Signs BP 112/75   Pulse 85   Temp 98.3 F (36.8 C)   Resp 18   Ht  (1.575 m)   Wt 81.6 kg (180 lb)   LMP 07/23/2017   SpO2 99%   BMI 32.92 kg/m   Physical Exam  Constitutional: She appears well-developed and well-nourished. No distress.  HENT:  Head: Normocephalic and atraumatic.  Eyes: Pupils are equal, round, and reactive to light. Conjunctivae and EOM are normal.  Neck: Normal range of motion. Neck supple.  Cardiovascular: Normal rate, regular rhythm, S1  normal and S2 normal.   No murmur heard. Pulmonary/Chest: Effort normal and breath sounds normal. She has no wheezes. She has no rales.  Abdominal: Soft. She exhibits no distension. There is no tenderness. There is no guarding.  Musculoskeletal: Normal range of motion. She exhibits no edema or deformity.  No external lesions of the mons, inner thigh, or vulva. No inguinal lymphadenopathy. All no lesions of the vaginal walls. Erythema without bleeding surrounding cervical os. Thin white discharge surrounding cervical os. No cervical motion tenderness, no adnexal tenderness, no suprapubic tenderness on bimanual  exam. Exam performed with nurse chaperone present.   Neurological: She is alert.  Spine Exam: Inspection/Palpation: No midline or paraspinal tenderness to cervical, thoracic, or lumbar regions. Strength: 5/5 throughout LE bilaterally (hip flexion/extension, adduction/abduction; knee flexion/extension; foot dorsiflexion/plantarflexion, inversion/eversion; great toe inversion) Sensation: Intact to light touch in proximal and distal LE bilaterally Reflexes: 2+ quadriceps and achilles reflexes  Skin: Skin is warm and dry. No rash noted. No erythema.  Psychiatric: She has a normal mood and affect. Her behavior is normal. Judgment and thought content normal.  Nursing note and vitals reviewed.    ED Treatments / Results  Labs (all labs ordered are listed, but only abnormal results are displayed) Labs Reviewed  URINALYSIS, ROUTINE W REFLEX MICROSCOPIC - Abnormal; Notable for the following:       Result Value   Specific Gravity, Urine >1.030 (*)    All other components within normal limits  PREGNANCY, URINE    EKG  EKG Interpretation None       Radiology No results found.  Procedures Procedures (including critical care time)  Medications Ordered in ED Medications - No data to display   Initial Impression / Assessment and Plan / ED Course  I have reviewed the triage vital signs and the nursing notes.  Pertinent labs & imaging results that were available during my care of the patient were reviewed by me and considered in my medical decision making (see chart for details).   Final Clinical Impressions(s) / ED Diagnoses   Final diagnoses:  None   MDM  Patient is a 26 y.o. female with no significant past medical history presenting for 2-3 weeks of malodorous vaginal discharge. Patient is well-appearing and in no acute distress. Bacterial vaginosis diagnosed by wet prep. Patient informed that she will receive GC and chlamydia results by phone only if they're positive. Patient  declined HIV and syphilis testing today. Patient reports she does not feel she was exposed to an STI therefore declined prophylactic treatment today. No red flags on history or exam for spinal causes of back pain today. Suspect acute lumbar strain due to patient's work environment. Patient reports that muscle relaxants do not work for her, therefore was prescribed short course of ibuprofen. Patient given exercises, and encouraged to follow up with a primary care provider. Patient given return precautions for any worsening discharge, lower abdominal pain, severe nausea and vomiting, fever, or chills. For your back pain, please return if you have numbness or weakness in her legs, numbness in the area where he weight, retention of urine, loss of bowel or bladder control. Patient is in understanding and agrees with the plan of care.  Nursing notes reviewed. Vital signs reviewed. All questions answered by patient and.  New Prescriptions New Prescriptions   No medications on file     Delia Chimes 09/03/17 1842    Azalia Bilis, MD 09/04/17 1606

## 2017-09-04 LAB — GC/CHLAMYDIA PROBE AMP (~~LOC~~) NOT AT ARMC
Chlamydia: NEGATIVE
Neisseria Gonorrhea: NEGATIVE

## 2018-02-19 ENCOUNTER — Encounter (HOSPITAL_BASED_OUTPATIENT_CLINIC_OR_DEPARTMENT_OTHER): Payer: Self-pay | Admitting: Emergency Medicine

## 2018-02-19 ENCOUNTER — Other Ambulatory Visit: Payer: Self-pay

## 2018-02-19 ENCOUNTER — Emergency Department (HOSPITAL_BASED_OUTPATIENT_CLINIC_OR_DEPARTMENT_OTHER)
Admission: EM | Admit: 2018-02-19 | Discharge: 2018-02-19 | Disposition: A | Discharge status: Home / Self Care | Payer: Self-pay | Attending: Emergency Medicine | Admitting: Emergency Medicine

## 2018-02-19 DIAGNOSIS — Z5321 Procedure and treatment not carried out due to patient leaving prior to being seen by health care provider: Secondary | ICD-10-CM | POA: Insufficient documentation

## 2018-02-19 DIAGNOSIS — N898 Other specified noninflammatory disorders of vagina: Secondary | ICD-10-CM | POA: Insufficient documentation

## 2018-02-19 NOTE — ED Triage Notes (Signed)
Vaginal discharge x 1 week.  

## 2018-02-19 NOTE — ED Notes (Signed)
Patient upset with the waiting room and walks out states that she is leaving  

## 2018-02-20 NOTE — ED Notes (Signed)
02/20/2018, 16:20 Follow- up call completed

## 2018-04-12 ENCOUNTER — Emergency Department (HOSPITAL_BASED_OUTPATIENT_CLINIC_OR_DEPARTMENT_OTHER)
Admission: EM | Admit: 2018-04-12 | Discharge: 2018-04-13 | Disposition: A | Discharge status: Home / Self Care | Payer: Self-pay | Attending: Emergency Medicine | Admitting: Emergency Medicine

## 2018-04-12 ENCOUNTER — Other Ambulatory Visit: Payer: Self-pay

## 2018-04-12 ENCOUNTER — Encounter (HOSPITAL_BASED_OUTPATIENT_CLINIC_OR_DEPARTMENT_OTHER): Payer: Self-pay | Admitting: *Deleted

## 2018-04-12 DIAGNOSIS — Z79899 Other long term (current) drug therapy: Secondary | ICD-10-CM | POA: Insufficient documentation

## 2018-04-12 DIAGNOSIS — N760 Acute vaginitis: Secondary | ICD-10-CM | POA: Insufficient documentation

## 2018-04-12 DIAGNOSIS — A5901 Trichomonal vulvovaginitis: Secondary | ICD-10-CM | POA: Insufficient documentation

## 2018-04-12 DIAGNOSIS — B9689 Other specified bacterial agents as the cause of diseases classified elsewhere: Secondary | ICD-10-CM | POA: Insufficient documentation

## 2018-04-12 LAB — URINALYSIS, ROUTINE W REFLEX MICROSCOPIC
Bilirubin Urine: NEGATIVE
Glucose, UA: NEGATIVE mg/dL
Ketones, ur: 15 mg/dL — AB
Nitrite: NEGATIVE
Protein, ur: NEGATIVE mg/dL
Specific Gravity, Urine: 1.025 (ref 1.005–1.030)
pH: 6.5 (ref 5.0–8.0)

## 2018-04-12 LAB — URINALYSIS, MICROSCOPIC (REFLEX)

## 2018-04-12 LAB — WET PREP, GENITAL
Sperm: NONE SEEN
Yeast Wet Prep HPF POC: NONE SEEN

## 2018-04-12 LAB — PREGNANCY, URINE: Preg Test, Ur: NEGATIVE

## 2018-04-12 MED ORDER — METRONIDAZOLE 500 MG PO TABS: 500.0000 mg | ORAL_TABLET | Freq: Two times a day (BID) | ORAL | 0 refills | Status: DC

## 2018-04-12 MED ORDER — METRONIDAZOLE 500 MG PO TABS
500.0000 mg | ORAL_TABLET | Freq: Once | ORAL | Status: AC
  Administered 2018-04-12: 500 mg via ORAL
  Filled 2018-04-12: qty 1

## 2018-04-12 NOTE — ED Provider Notes (Signed)
[facility] Provider Note: Lowella Dell, MD, FACEP  CSN: [id number] MRN: [id number] ARRIVAL: 04/12/18 at 1957 ROOM: [facility]04   CHIEF COMPLAINT  Vaginal Discharge   HISTORY OF PRESENT ILLNESS  04/12/18 11:17 PM Cindy Hunter is a 27 y.o. female with a four-day history of a vaginal discharge.  She describes the discharge as foul-smelling and brownish.  She denies associated pain.  The discharge is not profuse.   History reviewed. No pertinent past medical history.  Past Surgical History:  Procedure Laterality Date  . DILATION AND CURETTAGE OF UTERUS    . WISDOM TOOTH EXTRACTION      Family History  Problem Relation Age of Onset  . Hypertension Father   . Hypertension Maternal Grandmother   . Arthritis Maternal Grandmother   . Diabetes Maternal Grandfather   . Hypertension Maternal Grandfather     Social History   Tobacco Use  . Smoking status: Never Smoker  . Smokeless tobacco: Never Used  Substance Use Topics  . Alcohol use: No  . Drug use: No    Prior to Admission medications   Medication Sig Start Date End Date Taking? Authorizing Provider  ibuprofen (ADVIL,MOTRIN) 600 MG tablet Take 1 tablet (600 mg total) by mouth every 6 (six) hours as needed. 09/03/17   Aviva Kluver B, PA-C  metroNIDAZOLE (FLAGYL) 500 MG tablet Take 1 tablet (500 mg total) by mouth 2 (two) times daily. 09/03/17   Aviva Kluver B, PA-C    Allergies Penicillins and Phenergan [promethazine hcl]   REVIEW OF SYSTEMS  Negative except as noted here or in the History of Present Illness.   PHYSICAL EXAMINATION  Initial Vital Signs Blood pressure 110/78, pulse 75, temperature 98.1 F (36.7 C), temperature source Oral, resp. rate 20, height  (1.6 m), weight 81.6 kg (180 lb), last menstrual period 03/13/2018, SpO2 99 %, unknown if currently breastfeeding.  Examination General: Well-developed, well-nourished female in no acute distress; appearance consistent with age of  record HENT: normocephalic; atraumatic Eyes: pupils equal, round and reactive to light; extraocular muscles intact Neck: supple Heart: regular rate and rhythm Lungs: clear to auscultation bilaterally Abdomen: soft; nondistended; nontender; bowel sounds present GU: Tanner V female; scant whitish vaginal discharge; no vaginal bleeding; no cervical motion tenderness; no adnexal tenderness Extremities: No deformity; full range of motion; pulses normal Neurologic: Awake, alert and oriented; motor function intact in all extremities and symmetric; no facial droop Skin: Warm and dry Psychiatric: Normal mood and affect   RESULTS  Summary of this visit's results, reviewed by myself:   EKG Interpretation  Date/Time:    Ventricular Rate:    PR Interval:    QRS Duration:   QT Interval:    QTC Calculation:   R Axis:     Text Interpretation:        Laboratory Studies: Results for orders placed or performed during the hospital encounter of 04/12/18 (from the past 24 hour(s))  Urinalysis, Routine w reflex microscopic     Status: Abnormal   Collection Time: 04/12/18  9:35 PM  Result Value Ref Range   Color, Urine YELLOW YELLOW   APPearance HAZY (A) CLEAR   Specific Gravity, Urine 1.025 1.005 - 1.030   pH 6.5 5.0 - 8.0   Glucose, UA NEGATIVE NEGATIVE mg/dL   Hgb urine dipstick TRACE (A) NEGATIVE   Bilirubin Urine NEGATIVE NEGATIVE   Ketones, ur 15 (A) NEGATIVE mg/dL   Protein, ur NEGATIVE NEGATIVE mg/dL   Nitrite NEGATIVE NEGATIVE  Leukocytes, UA TRACE (A) NEGATIVE  Pregnancy, urine     Status: None   Collection Time: 04/12/18  9:35 PM  Result Value Ref Range   Preg Test, Ur NEGATIVE NEGATIVE  Urinalysis, Microscopic (reflex)     Status: Abnormal   Collection Time: 04/12/18  9:35 PM  Result Value Ref Range   RBC / HPF 0-5 0 - 5 RBC/hpf   WBC, UA 6-10 0 - 5 WBC/hpf   Bacteria, UA MANY (A) NONE SEEN   Squamous Epithelial / LPF 6-10 0 - 5   Mucus PRESENT    Trichomonas, UA  PRESENT   Wet prep, genital     Status: Abnormal   Collection Time: 04/12/18 11:25 PM  Result Value Ref Range   Yeast Wet Prep HPF POC NONE SEEN NONE SEEN   Trich, Wet Prep PRESENT (A) NONE SEEN   Clue Cells Wet Prep HPF POC PRESENT (A) NONE SEEN   WBC, Wet Prep HPF POC MANY (A) NONE SEEN   Sperm NONE SEEN    Imaging Studies: No results found.  ED COURSE and MDM  Nursing notes and initial vitals signs, including pulse oximetry, reviewed.  Vitals:   04/12/18 2015 04/12/18 2016 04/12/18 2314  BP:  108/71 110/78  Pulse:  92 75  Resp:  18 20  Temp:  98.1 F (36.7 C)   TempSrc:  Oral   SpO2:  99% 99%  Weight: 81.6 kg (180 lb)    Height:  (1.6 m)     We will treat for trichomoniasis and bacterial vaginosis.  PROCEDURES    ED DIAGNOSES     ICD-10-CM   1. Trichomoniasis of vagina A59.01   2. Bacterial vaginosis N76.0    B96.89        Elida Harbin, Jonny Ruiz, MD 04/12/18 2345

## 2018-04-12 NOTE — ED Triage Notes (Signed)
Vaginal discharge x 4 days 

## 2018-04-15 LAB — GC/CHLAMYDIA PROBE AMP (~~LOC~~) NOT AT ARMC
Chlamydia: POSITIVE — AB
Neisseria Gonorrhea: NEGATIVE

## 2018-04-16 ENCOUNTER — Telehealth: Payer: Self-pay | Admitting: Student

## 2018-04-16 DIAGNOSIS — A749 Chlamydial infection, unspecified: Secondary | ICD-10-CM

## 2018-04-16 MED ORDER — AZITHROMYCIN 500 MG PO TABS: 1000.0000 mg | ORAL_TABLET | Freq: Once | ORAL | 0 refills | Status: AC

## 2018-04-16 NOTE — Telephone Encounter (Addendum)
Cindy Hunter tested positive for  Chlamydia. Patient was called by RN and allergies and pharmacy confirmed. Rx sent to pharmacy of choice.   Judeth Horn, NP 04/16/2018 2:51 PM       ----- Message from Kathe Becton, RN sent at 04/16/2018  2:23 PM EDT ----- This patient tested positive for :  chlamydia   She ::,"is allergic to Penicillin and Phenergan,  I have informed the patient of her results and confirmed her pharmacy is correct in her chart. Please send Rx.   Thank you,   Kathe Becton, RN   Results faxed to [facility].

## 2018-06-01 ENCOUNTER — Emergency Department (HOSPITAL_BASED_OUTPATIENT_CLINIC_OR_DEPARTMENT_OTHER): Payer: Self-pay

## 2018-06-01 ENCOUNTER — Emergency Department (HOSPITAL_BASED_OUTPATIENT_CLINIC_OR_DEPARTMENT_OTHER)
Admission: EM | Admit: 2018-06-01 | Discharge: 2018-06-01 | Disposition: A | Discharge status: Home / Self Care | Payer: Self-pay | Attending: Emergency Medicine | Admitting: Emergency Medicine

## 2018-06-01 ENCOUNTER — Other Ambulatory Visit: Payer: Self-pay

## 2018-06-01 ENCOUNTER — Encounter (HOSPITAL_BASED_OUTPATIENT_CLINIC_OR_DEPARTMENT_OTHER): Payer: Self-pay | Admitting: Emergency Medicine

## 2018-06-01 DIAGNOSIS — N10 Acute tubulo-interstitial nephritis: Secondary | ICD-10-CM | POA: Insufficient documentation

## 2018-06-01 DIAGNOSIS — N12 Tubulo-interstitial nephritis, not specified as acute or chronic: Secondary | ICD-10-CM

## 2018-06-01 DIAGNOSIS — Z79899 Other long term (current) drug therapy: Secondary | ICD-10-CM | POA: Insufficient documentation

## 2018-06-01 LAB — URINALYSIS, MICROSCOPIC (REFLEX)

## 2018-06-01 LAB — CBC
HCT: 37.8 % (ref 36.0–46.0)
Hemoglobin: 12.5 g/dL (ref 12.0–15.0)
MCH: 26.4 pg (ref 26.0–34.0)
MCHC: 33.1 g/dL (ref 30.0–36.0)
MCV: 79.9 fL (ref 78.0–100.0)
Platelets: 255 10*3/uL (ref 150–400)
RBC: 4.73 MIL/uL (ref 3.87–5.11)
RDW: 14.9 % (ref 11.5–15.5)
WBC: 12.1 10*3/uL — ABNORMAL HIGH (ref 4.0–10.5)

## 2018-06-01 LAB — BASIC METABOLIC PANEL
Anion gap: 7 (ref 5–15)
BUN: 7 mg/dL (ref 6–20)
CO2: 26 mmol/L (ref 22–32)
Calcium: 8.8 mg/dL — ABNORMAL LOW (ref 8.9–10.3)
Chloride: 105 mmol/L (ref 101–111)
Creatinine, Ser: 1.06 mg/dL — ABNORMAL HIGH (ref 0.44–1.00)
GFR calc Af Amer: >60 mL/min (ref >60)
GFR calc non Af Amer: >60 mL/min (ref >60)
Glucose, Bld: 91 mg/dL (ref 65–99)
Potassium: 3.6 mmol/L (ref 3.5–5.1)
Sodium: 138 mmol/L (ref 135–145)

## 2018-06-01 LAB — URINALYSIS, ROUTINE W REFLEX MICROSCOPIC
Bilirubin Urine: NEGATIVE
Glucose, UA: NEGATIVE mg/dL
Ketones, ur: NEGATIVE mg/dL
Nitrite: POSITIVE — AB
Protein, ur: 30 mg/dL — AB
Specific Gravity, Urine: 1.02 (ref 1.005–1.030)
pH: 6 (ref 5.0–8.0)

## 2018-06-01 LAB — PREGNANCY, URINE: Preg Test, Ur: NEGATIVE

## 2018-06-01 MED ORDER — SODIUM CHLORIDE 0.9 % IV SOLN
1.0000 g | Freq: Once | INTRAVENOUS | Status: AC
  Administered 2018-06-01: 1 g via INTRAVENOUS
  Filled 2018-06-01: qty 10

## 2018-06-01 MED ORDER — ONDANSETRON HCL 4 MG PO TABS: 4.0000 mg | ORAL_TABLET | Freq: Four times a day (QID) | ORAL | 0 refills | Status: DC

## 2018-06-01 MED ORDER — SODIUM CHLORIDE 0.9 % IV BOLUS
1000.0000 mL | Freq: Once | INTRAVENOUS | Status: AC
  Administered 2018-06-01: 1000 mL via INTRAVENOUS

## 2018-06-01 MED ORDER — ACETAMINOPHEN 500 MG PO TABS
500.0000 mg | ORAL_TABLET | Freq: Once | ORAL | Status: AC
  Administered 2018-06-01: 500 mg via ORAL
  Filled 2018-06-01: qty 1

## 2018-06-01 MED ORDER — KETOROLAC TROMETHAMINE 30 MG/ML IJ SOLN
30.0000 mg | Freq: Once | INTRAMUSCULAR | Status: AC
  Administered 2018-06-01: 30 mg via INTRAVENOUS
  Filled 2018-06-01: qty 1

## 2018-06-01 MED ORDER — CEPHALEXIN 500 MG PO CAPS: 500.0000 mg | ORAL_CAPSULE | Freq: Four times a day (QID) | ORAL | 0 refills | Status: AC

## 2018-06-01 MED ORDER — ACETAMINOPHEN 325 MG PO TABS
650.0000 mg | ORAL_TABLET | Freq: Once | ORAL | Status: AC | PRN
  Administered 2018-06-01: 650 mg via ORAL
  Filled 2018-06-01: qty 2

## 2018-06-01 MED ORDER — ACETAMINOPHEN 500 MG PO TABS: 500.0000 mg | ORAL_TABLET | Freq: Four times a day (QID) | ORAL | 0 refills | Status: DC | PRN

## 2018-06-01 MED ORDER — ONDANSETRON HCL 4 MG/2ML IJ SOLN
4.0000 mg | Freq: Once | INTRAMUSCULAR | Status: AC
  Administered 2018-06-01: 4 mg via INTRAVENOUS
  Filled 2018-06-01: qty 2

## 2018-06-01 MED ORDER — NAPROXEN 500 MG PO TABS: 500.0000 mg | ORAL_TABLET | Freq: Two times a day (BID) | ORAL | 0 refills | Status: DC

## 2018-06-01 NOTE — ED Notes (Signed)
Patient transported to CT 

## 2018-06-01 NOTE — ED Triage Notes (Signed)
ptient states that she has had pain to her left flank x 2 weeks. Patient states that she has had some nausea

## 2018-06-01 NOTE — Discharge Instructions (Signed)
Medications: Keflex, Zofran, naprosyn, Tylenol  Treatment: Take Keflex until completed. Take Zofran every 6 hours as needed for nausea or coming. Take naprosyn and Tylenol as prescribed for your pain and fever. Do not combine naprosyn with ibuprofen or Aleve. Make sure to drink plenty of fluids.  Follow-up: Please return to the emergency department if you develop any new or worsening symptoms including severe worsening pain, intractable vomiting, passing out, or any other new or worsening symptoms. You will be called if your urine culture required a change in your antibiotic.

## 2018-06-01 NOTE — ED Provider Notes (Signed)
[facility] EMERGENCY DEPARTMENT Provider Note   CSN: [id number] Arrival date & time: 06/01/18  1520     History   Chief Complaint Chief Complaint  Patient presents with  . Flank Pain    HPI Cindy Hunter is a 27 y.o. female who is previously healthy who presents with a 2-week history of left flank pain.  She reports 2 weeks ago she had urinary tract symptoms like urinary frequency and dysuria.  She took over-the-counter Azo which seemed to improve that, however her left flank pain persisted.  Over the past week she has had no appetite and nausea.  She denies any vomiting.  She has had chills at home.  She has a fever on arrival today.  She last took ibuprofen at 4 AM this morning.  She denies any history of kidney stones.  She denies any chest pain, shortness of breath, abdominal pain, abnormal vaginal bleeding or discharge, concern for STD exposure.  HPI  History reviewed. No pertinent past medical history.  There are no active problems to display for this patient.   Past Surgical History:  Procedure Laterality Date  . DILATION AND CURETTAGE OF UTERUS    . WISDOM TOOTH EXTRACTION       OB History    Gravida  4   Para      Term      Preterm      AB  2   Living        SAB  1   TAB  1   Ectopic      Multiple      Live Births               Home Medications    Prior to Admission medications   Medication Sig Start Date End Date Taking? Authorizing Provider  acetaminophen (TYLENOL) 500 MG tablet Take 1 tablet (500 mg total) by mouth every 6 (six) hours as needed. 06/01/18   Khalfani Weideman, Waylan Boga, PA-C  cephALEXin (KEFLEX) 500 MG capsule Take 1 capsule (500 mg total) by mouth 4 (four) times daily for 14 days. 06/01/18 06/15/18  Emi Holes, PA-C  metroNIDAZOLE (FLAGYL) 500 MG tablet Take 1 tablet (500 mg total) by mouth 2 (two) times daily. One po bid x 7 days 04/12/18   Molpus, John, MD  naproxen (NAPROSYN) 500 MG tablet Take 1 tablet (500 mg  total) by mouth 2 (two) times daily. 06/01/18   Regena Delucchi, Waylan Boga, PA-C  ondansetron (ZOFRAN) 4 MG tablet Take 1 tablet (4 mg total) by mouth every 6 (six) hours. 06/01/18   Emi Holes, PA-C    Family History Family History  Problem Relation Age of Onset  . Hypertension Father   . Hypertension Maternal Grandmother   . Arthritis Maternal Grandmother   . Diabetes Maternal Grandfather   . Hypertension Maternal Grandfather     Social History Social History   Tobacco Use  . Smoking status: Never Smoker  . Smokeless tobacco: Never Used  Substance Use Topics  . Alcohol use: No  . Drug use: No     Allergies   Penicillins and Phenergan [promethazine hcl]   Review of Systems Review of Systems  Constitutional: Positive for appetite change and chills. Negative for fever.  HENT: Negative for facial swelling and sore throat.   Respiratory: Negative for shortness of breath.   Cardiovascular: Negative for chest pain.  Gastrointestinal: Positive for nausea. Negative for abdominal pain and vomiting.  Genitourinary: Positive for dysuria, flank  pain and frequency. Negative for pelvic pain, vaginal bleeding and vaginal discharge.  Musculoskeletal: Positive for back pain (L flank).  Skin: Negative for rash and wound.  Neurological: Negative for headaches.  Psychiatric/Behavioral: The patient is not nervous/anxious.      Physical Exam Updated Vital Signs BP 99/64 (BP Location: Right Arm)   Pulse 98   Temp 98.3 F (36.8 C) (Oral)   Resp 18   Ht 5\' 2"  (1.575 m)   Wt 80.7 kg (178 lb)   LMP 05/11/2018   SpO2 98%   BMI 32.56 kg/m   Physical Exam  Constitutional: She appears well-developed and well-nourished. No distress.  HENT:  Head: Normocephalic and atraumatic.  Mouth/Throat: Oropharynx is clear and moist. No oropharyngeal exudate.  Eyes: Pupils are equal, round, and reactive to light. Conjunctivae are normal. Right eye exhibits no discharge. Left eye exhibits no discharge.  No scleral icterus.  Neck: Normal range of motion. Neck supple. No thyromegaly present.  Cardiovascular: Normal rate, regular rhythm, normal heart sounds and intact distal pulses. Exam reveals no gallop and no friction rub.  No murmur heard. Pulmonary/Chest: Effort normal and breath sounds normal. No stridor. No respiratory distress. She has no wheezes. She has no rales.  Abdominal: Soft. Bowel sounds are normal. She exhibits no distension. There is no tenderness. There is CVA tenderness. There is no rebound and no guarding.  Musculoskeletal: She exhibits no edema.       Back:  Lymphadenopathy:    She has no cervical adenopathy.  Neurological: She is alert. Coordination normal.  Skin: Skin is warm and dry. No rash noted. She is not diaphoretic. No pallor.  Psychiatric: She has a normal mood and affect.  Nursing note and vitals reviewed.    ED Treatments / Results  Labs (all labs ordered are listed, but only abnormal results are displayed) Labs Reviewed  URINALYSIS, ROUTINE W REFLEX MICROSCOPIC - Abnormal; Notable for the following components:      Result Value   APPearance CLOUDY (*)    Hgb urine dipstick LARGE (*)    Protein, ur 30 (*)    Nitrite POSITIVE (*)    Leukocytes, UA LARGE (*)    All other components within normal limits  BASIC METABOLIC PANEL - Abnormal; Notable for the following components:   Creatinine, Ser 1.06 (*)    Calcium 8.8 (*)    All other components within normal limits  CBC - Abnormal; Notable for the following components:   WBC 12.1 (*)    All other components within normal limits  URINALYSIS, MICROSCOPIC (REFLEX) - Abnormal; Notable for the following components:   Bacteria, UA MANY (*)    All other components within normal limits  URINE CULTURE  PREGNANCY, URINE    EKG None  Radiology Ct Renal Stone Study  Result Date: 06/01/2018 CLINICAL DATA:  Left flank pain and hematuria. EXAM: CT ABDOMEN AND PELVIS WITHOUT CONTRAST TECHNIQUE:  Multidetector CT imaging of the abdomen and pelvis was performed following the standard protocol without IV contrast. COMPARISON:  None. FINDINGS: Lower chest: No acute abnormality. Hepatobiliary: No focal liver abnormality is seen. No gallstones, gallbladder wall thickening, or biliary dilatation. Pancreas: Unremarkable. No pancreatic ductal dilatation or surrounding inflammatory changes. Spleen: Normal in size. Small, smoothly marginated, linear parenchymal hypodensity near the hilum likely represents a splenic cleft. Adrenals/Urinary Tract: Adrenal glands are unremarkable. Kidneys are normal, without renal calculi, focal lesion, or hydronephrosis. Bladder is decompressed. Stomach/Bowel: Stomach is within normal limits. Appendix appears normal. No evidence  of bowel wall thickening, distention, or inflammatory changes. Vascular/Lymphatic: No significant vascular findings are present. No enlarged abdominal or pelvic lymph nodes. Reproductive: Uterus and bilateral adnexa are unremarkable. Other: No abdominal wall hernia or abnormality. No abdominopelvic ascites. No pneumoperitoneum. Musculoskeletal: No acute or significant osseous findings. IMPRESSION: 1. Normal noncontrast CT of the abdomen and pelvis. Electronically Signed   By: Obie DredgeWilliam T Derry M.D.   On: 06/01/2018 18:31    Procedures Procedures (including critical care time)  Medications Ordered in ED Medications  acetaminophen (TYLENOL) tablet 650 mg (650 mg Oral Given 06/01/18 1709)  sodium chloride 0.9 % bolus 1,000 mL (0 mLs Intravenous Stopped 06/01/18 1800)  ondansetron (ZOFRAN) injection 4 mg (4 mg Intravenous Given 06/01/18 1709)  ketorolac (TORADOL) 30 MG/ML injection 30 mg (30 mg Intravenous Given 06/01/18 1715)  cefTRIAXone (ROCEPHIN) 1 g in sodium chloride 0.9 % 100 mL IVPB (0 g Intravenous Stopped 06/01/18 1925)  acetaminophen (TYLENOL) tablet 500 mg (500 mg Oral Given 06/01/18 1933)     Initial Impression / Assessment and Plan / ED Course   I have reviewed the triage vital signs and the nursing notes.  Pertinent labs & imaging results that were available during my care of the patient were reviewed by me and considered in my medical decision making (see chart for details).     Patient with pyelonephritis.  UA shows large hematuria, positive nitrites, large leukocytes, 21-50 RBCs and WBCs, and many bacteria.  Urine culture sent.  Urine pregnancy negative.  Mild elevation in white count, 12.1.  BMP shows mild dehydration with creatinine 1.06, otherwise normal.  CT renal stone study is negative for stone.  Will treat with Keflex.  Patient given dose of Rocephin prior to discharge.  Patient is feeling better with fluids, Toradol, Zofran in the ED as well and appears safe for trial of outpatient antibiotics.  Patient also given supportive treatment including Zofran, Naprosyn, Tylenol. Patient given strict return precautions.  Patient understands and agrees with plan.  Patient vitals stable throughout ED course and discharged in satisfactory condition. I discussed patient case with Dr. Silverio LayYao who guided the patient's management and agrees with plan.   Final Clinical Impressions(s) / ED Diagnoses   Final diagnoses:  Pyelonephritis    ED Discharge Orders        Ordered    cephALEXin (KEFLEX) 500 MG capsule  4 times daily     06/01/18 1921    naproxen (NAPROSYN) 500 MG tablet  2 times daily     06/01/18 1921    acetaminophen (TYLENOL) 500 MG tablet  Every 6 hours PRN     06/01/18 1921    ondansetron (ZOFRAN) 4 MG tablet  Every 6 hours     06/01/18 1921       Emi HolesLaw, Thelmer Legler M, PA-C 06/02/18 1509    Charlynne PanderYao, David Hsienta, MD 06/02/18 1517

## 2018-06-04 LAB — URINE CULTURE: Culture: >=100000 — AB

## 2018-06-05 ENCOUNTER — Telehealth: Payer: Self-pay | Admitting: *Deleted

## 2018-06-05 NOTE — Telephone Encounter (Signed)
Post ED Visit - Positive Culture Follow-up  Culture report reviewed by antimicrobial stewardship pharmacist:  []  Enzo BiNathan Batchelder, Pharm.D. []  Celedonio MiyamotoJeremy Frens, Pharm.D., BCPS AQ-ID []  Garvin FilaMike Maccia, Pharm.D., BCPS []  Georgina PillionElizabeth Martin, Pharm.D., BCPS []  ColerainMinh Pham, [address] BoulevardPharm.D., BCPS, AAHIVP []  Estella HuskMichelle Turner, Pharm.D., BCPS, AAHIVP []  Lysle Pearlachel Rumbarger, PharmD, BCPS []  Sherlynn CarbonAustin Lucas, PharmD []  Pollyann SamplesAndy Johnston, PharmD, BCPS  Ladell PierBrooke Baggett, PharmD  Positive urine culture Treated with Cephalexin, organism sensitive to the same and no further patient follow-up is required at this time.  Virl AxeRobertson, Inesha Sow [facility] 06/05/2018, 10:34 AM

## 2019-04-29 ENCOUNTER — Emergency Department (HOSPITAL_BASED_OUTPATIENT_CLINIC_OR_DEPARTMENT_OTHER)
Admission: EM | Admit: 2019-04-29 | Discharge: 2019-04-29 | Disposition: A | Discharge status: Home / Self Care | Payer: Self-pay | Attending: Emergency Medicine | Admitting: Emergency Medicine

## 2019-04-29 ENCOUNTER — Encounter (HOSPITAL_BASED_OUTPATIENT_CLINIC_OR_DEPARTMENT_OTHER): Payer: Self-pay | Admitting: Emergency Medicine

## 2019-04-29 ENCOUNTER — Emergency Department (HOSPITAL_BASED_OUTPATIENT_CLINIC_OR_DEPARTMENT_OTHER): Payer: Self-pay

## 2019-04-29 ENCOUNTER — Other Ambulatory Visit: Payer: Self-pay

## 2019-04-29 DIAGNOSIS — R102 Pelvic and perineal pain: Secondary | ICD-10-CM | POA: Insufficient documentation

## 2019-04-29 LAB — URINALYSIS, ROUTINE W REFLEX MICROSCOPIC
Bilirubin Urine: NEGATIVE
Glucose, UA: NEGATIVE mg/dL
Hgb urine dipstick: NEGATIVE
Ketones, ur: 15 mg/dL — AB
Leukocytes,Ua: NEGATIVE
Nitrite: NEGATIVE
Protein, ur: NEGATIVE mg/dL
Specific Gravity, Urine: >1.03 — ABNORMAL HIGH (ref 1.005–1.030)
pH: 6 (ref 5.0–8.0)

## 2019-04-29 LAB — CBC WITH DIFFERENTIAL/PLATELET
Abs Immature Granulocytes: 0.02 10*3/uL (ref 0.00–0.07)
Basophils Absolute: 0 10*3/uL (ref 0.0–0.1)
Basophils Relative: 0 %
Eosinophils Absolute: 0.1 10*3/uL (ref 0.0–0.5)
Eosinophils Relative: 1 %
HCT: 38.7 % (ref 36.0–46.0)
Hemoglobin: 12.1 g/dL (ref 12.0–15.0)
Immature Granulocytes: 0 %
Lymphocytes Relative: 22 %
Lymphs Abs: 1.9 10*3/uL (ref 0.7–4.0)
MCH: 25.9 pg — ABNORMAL LOW (ref 26.0–34.0)
MCHC: 31.3 g/dL (ref 30.0–36.0)
MCV: 82.7 fL (ref 80.0–100.0)
Monocytes Absolute: 0.5 10*3/uL (ref 0.1–1.0)
Monocytes Relative: 6 %
Neutro Abs: 6.2 10*3/uL (ref 1.7–7.7)
Neutrophils Relative %: 71 %
Platelets: 273 10*3/uL (ref 150–400)
RBC: 4.68 MIL/uL (ref 3.87–5.11)
RDW: 15.4 % (ref 11.5–15.5)
WBC: 8.8 10*3/uL (ref 4.0–10.5)
nRBC: 0 % (ref 0.0–0.2)

## 2019-04-29 LAB — COMPREHENSIVE METABOLIC PANEL
ALT: 13 U/L (ref 0–44)
AST: 16 U/L (ref 15–41)
Albumin: 4.2 g/dL (ref 3.5–5.0)
Alkaline Phosphatase: 52 U/L (ref 38–126)
Anion gap: 9 (ref 5–15)
BUN: 12 mg/dL (ref 6–20)
CO2: 25 mmol/L (ref 22–32)
Calcium: 9.3 mg/dL (ref 8.9–10.3)
Chloride: 104 mmol/L (ref 98–111)
Creatinine, Ser: 0.77 mg/dL (ref 0.44–1.00)
GFR calc Af Amer: >60 mL/min (ref >60)
GFR calc non Af Amer: >60 mL/min (ref >60)
Glucose, Bld: 81 mg/dL (ref 70–99)
Potassium: 4 mmol/L (ref 3.5–5.1)
Sodium: 138 mmol/L (ref 135–145)
Total Bilirubin: 0.6 mg/dL (ref 0.3–1.2)
Total Protein: 8.1 g/dL (ref 6.5–8.1)

## 2019-04-29 LAB — WET PREP, GENITAL
Sperm: NONE SEEN
Trich, Wet Prep: NONE SEEN
Yeast Wet Prep HPF POC: NONE SEEN

## 2019-04-29 LAB — PREGNANCY, URINE: Preg Test, Ur: NEGATIVE

## 2019-04-29 NOTE — ED Notes (Signed)
ED Provider at bedside. 

## 2019-04-29 NOTE — Discharge Instructions (Addendum)
Your lab work and ultrasound are reassuring. You may have a viral illness causing the diarrhea. Clear liquid diet, advance to bland diet as tolerated. Follow up with your doctor, return to the ER for worsening or concerning symptoms.

## 2019-04-29 NOTE — ED Provider Notes (Signed)
[facility] EMERGENCY DEPARTMENT Provider Note   CSN: [id number] Arrival date & time: 04/29/19  1159    History   Chief Complaint Chief Complaint  Patient presents with   Abdominal Pain    HPI Cindy Hunter is a 28 y.o. female.     28yo female with complaint of LLQ abdominal pain x 1 week, intermittent, worse with walking or standing up too quickly. Improves with sitting in a hot tub. Pain is shrap in nature, does not radiate. Reports loose/watery, non bloody stools 3 times yesterday, 2 times today. States she took antibiotics (2 pink pills) the day her symptoms started, [facility]. States her significant other had an infection in his tubes and she had to be treated- unknown infection but states it was not gonorrhea. Denies fever, vaginal discharge. Reports urinary frequency without dysuria. LMP unknown, unknown possibility of pregnancy today.      History reviewed. No pertinent past medical history.  There are no active problems to display for this patient.   Past Surgical History:  Procedure Laterality Date   THERAPEUTIC ABORTION     WISDOM TOOTH EXTRACTION       OB History    Gravida  5   Para  2   Term  2   Preterm  0   AB  3   Living  2     SAB  0   TAB  3   Ectopic  0   Multiple  0   Live Births  2            Home Medications    Prior to Admission medications   Not on File    Family History Family History  Problem Relation Age of Onset   Hypertension Father    Hypertension Maternal Grandmother    Arthritis Maternal Grandmother    Diabetes Maternal Grandfather    Hypertension Maternal Grandfather     Social History Social History   Tobacco Use   Smoking status: Never Smoker   Smokeless tobacco: Never Used  Substance Use Topics   Alcohol use: No   Drug use: No     Allergies   Penicillins and Phenergan [promethazine hcl]   Review of Systems Review  of Systems  Constitutional: Negative for chills, diaphoresis and fever.  Gastrointestinal: Positive for abdominal pain and diarrhea. Negative for blood in stool, constipation, nausea and vomiting.  Genitourinary: Positive for frequency. Negative for dysuria and vaginal discharge.  Skin: Negative for rash and wound.  Allergic/Immunologic: Negative for immunocompromised state.  All other systems reviewed and are negative.    Physical Exam Updated Vital Signs BP 110/76 (BP Location: Right Arm)    Pulse 85    Temp 98.5 F (36.9 C) (Oral)    Resp 16    Ht 5\' 2"  (1.575 m)    Wt 83.5 kg    LMP 03/31/2019    SpO2 100%    BMI 33.65 kg/m   Physical Exam Vitals signs and nursing note reviewed. Exam conducted with a chaperone present.  Constitutional:      General: She is not in acute distress.    Appearance: She is well-developed. She is not diaphoretic.  HENT:     Head: Normocephalic and atraumatic.  Cardiovascular:     Rate and Rhythm: Normal rate and regular rhythm.     Heart sounds: Normal heart sounds.  Pulmonary:     Effort: Pulmonary effort is normal.  Breath sounds: Normal breath sounds.  Abdominal:     Palpations: Abdomen is soft.     Tenderness: There is abdominal tenderness in the left lower quadrant. There is no right CVA tenderness or left CVA tenderness.  Genitourinary:    Vagina: Normal.     Cervix: No cervical motion tenderness or discharge.     Uterus: Normal.      Adnexa: Right adnexa normal.       Left: Tenderness present. No mass or fullness.    Skin:    General: Skin is warm and dry.     Findings: No erythema or rash.  Neurological:     Mental Status: She is alert and oriented to person, place, and time.  Psychiatric:        Behavior: Behavior normal.      ED Treatments / Results  Labs (all labs ordered are listed, but only abnormal results are displayed) Labs Reviewed  WET PREP, GENITAL - Abnormal; Notable for the following components:      Result  Value   Clue Cells Wet Prep HPF POC PRESENT (*)    WBC, Wet Prep HPF POC MANY (*)    All other components within normal limits  URINALYSIS, ROUTINE W REFLEX MICROSCOPIC - Abnormal; Notable for the following components:   Specific Gravity, Urine >1.030 (*)    Ketones, ur 15 (*)    All other components within normal limits  CBC WITH DIFFERENTIAL/PLATELET - Abnormal; Notable for the following components:   MCH 25.9 (*)    All other components within normal limits  PREGNANCY, URINE  COMPREHENSIVE METABOLIC PANEL  GC/CHLAMYDIA PROBE AMP ([place]) NOT AT [facility]    EKG None  Radiology US Transvaginal Non-ob  Result Date: 04/29/2019 CLINICAL DATA:  Initial evaluation for acute left lower quadrant pain. EXAM: TRANSABDOMINAL AND TRANSVAGINAL ULTRASOUND OF PELVIS DOPPLER ULTRASOUND OF OVARIES TECHNIQUE: Both transabdominal and transvaginal ultrasound examinations of the pelvis were performed. Transabdominal technique was performed for global imaging of the pelvis including uterus, ovaries, adnexal regions, and pelvic cul-de-sac. It was necessary to proceed with endovaginal exam following the transabdominal exam to visualize the uterus, endometrium, and ovaries. Color and duplex Doppler ultrasound was utilized to evaluate blood flow to the ovaries. COMPARISON:  Prior CT from 06/01/2018 FINDINGS: Uterus Measurements: 7.1 x 3.8 x 4.2 cm = volume: 58.7 mL. No fibroids or other mass visualized. Endometrium Thickness: 4.4 mm.  No focal abnormality visualized. Right ovary Measurements: 2.5 x 1.6 x 2.4 cm = volume: 5.0 mL. Normal appearance/no adnexal mass. Left ovary Measurements: 3.6 x 2.0 x 2.4 cm = volume: 8.0 mL. Normal appearance/no adnexal mass. Pulsed Doppler evaluation of both ovaries demonstrates normal low-resistance arterial and venous waveforms. Other findings Trace free physiologic fluid present within the pelvis. IMPRESSION: Normal pelvic ultrasound. No evidence for torsion or other acute  finding. Electronically Signed   By: Rise Mu M.D.   On: 04/29/2019 15:31   US Pelvis Complete  Result Date: 04/29/2019 CLINICAL DATA:  Initial evaluation for acute left lower quadrant pain. EXAM: TRANSABDOMINAL AND TRANSVAGINAL ULTRASOUND OF PELVIS DOPPLER ULTRASOUND OF OVARIES TECHNIQUE: Both transabdominal and transvaginal ultrasound examinations of the pelvis were performed. Transabdominal technique was performed for global imaging of the pelvis including uterus, ovaries, adnexal regions, and pelvic cul-de-sac. It was necessary to proceed with endovaginal exam following the transabdominal exam to visualize the uterus, endometrium, and ovaries. Color and duplex Doppler ultrasound was utilized to evaluate blood flow to the ovaries. COMPARISON:  Prior  CT from 06/01/2018 FINDINGS: Uterus Measurements: 7.1 x 3.8 x 4.2 cm = volume: 58.7 mL. No fibroids or other mass visualized. Endometrium Thickness: 4.4 mm.  No focal abnormality visualized. Right ovary Measurements: 2.5 x 1.6 x 2.4 cm = volume: 5.0 mL. Normal appearance/no adnexal mass. Left ovary Measurements: 3.6 x 2.0 x 2.4 cm = volume: 8.0 mL. Normal appearance/no adnexal mass. Pulsed Doppler evaluation of both ovaries demonstrates normal low-resistance arterial and venous waveforms. Other findings Trace free physiologic fluid present within the pelvis. IMPRESSION: Normal pelvic ultrasound. No evidence for torsion or other acute finding. Electronically Signed   By: Rise MuBenjamin  McClintock M.D.   On: 04/29/2019 15:31   [place] Art/ven Flow Abd Pelv Doppler  Result Date: 04/29/2019 CLINICAL DATA:  Initial evaluation for acute left lower quadrant pain. EXAM: TRANSABDOMINAL AND TRANSVAGINAL ULTRASOUND OF PELVIS DOPPLER ULTRASOUND OF OVARIES TECHNIQUE: Both transabdominal and transvaginal ultrasound examinations of the pelvis were performed. Transabdominal technique was performed for global imaging of the pelvis including uterus, ovaries, adnexal regions,  and pelvic cul-de-sac. It was necessary to proceed with endovaginal exam following the transabdominal exam to visualize the uterus, endometrium, and ovaries. Color and duplex Doppler ultrasound was utilized to evaluate blood flow to the ovaries. COMPARISON:  Prior CT from 06/01/2018 FINDINGS: Uterus Measurements: 7.1 x 3.8 x 4.2 cm = volume: 58.7 mL. No fibroids or other mass visualized. Endometrium Thickness: 4.4 mm.  No focal abnormality visualized. Right ovary Measurements: 2.5 x 1.6 x 2.4 cm = volume: 5.0 mL. Normal appearance/no adnexal mass. Left ovary Measurements: 3.6 x 2.0 x 2.4 cm = volume: 8.0 mL. Normal appearance/no adnexal mass. Pulsed Doppler evaluation of both ovaries demonstrates normal low-resistance arterial and venous waveforms. Other findings Trace free physiologic fluid present within the pelvis. IMPRESSION: Normal pelvic ultrasound. No evidence for torsion or other acute finding. Electronically Signed   By: Rise MuBenjamin  McClintock M.D.   On: 04/29/2019 15:31    Procedures Procedures (including critical care time)  Medications Ordered in ED Medications - No data to display   Initial Impression / Assessment and Plan / ED Course  I have reviewed the triage vital signs and the nursing notes.  Pertinent labs & imaging results that were available during my care of the patient were reviewed by me and considered in my medical decision making (see chart for details).  Clinical Course as of Apr 28 1638  Tue Apr 29, 2019  [id number] 28 year old female with complaint of left lower quadrant pain x1 week with loose nonbloody stools.  Patient symptoms started 1 week ago when she [facility] the [facility] and took unknown medication for unknown infection however states this is not an STD.  Discussed with pharmacist who is unable to identify based on description the big pink pills the patient was given to take that day.  On exam patient has mild left lower abdominal tenderness, also tender left  adnexa on pelvic exam.  Patient denies complaints of vaginal discharge.  Review of lab work shows a normal CMP, CBC without a significant changes, wet prep with clue cells present without other complaints.  Patient is not pregnant, GC chlamydia testing pending.  Ultrasound negative for torsion, TOA, ovarian cyst.  Advised patient she can take Imodium for her diarrhea, return to the ER for fever or worsening pain otherwise follow-up with PCP.  Patient verbalizes understanding of discharge instructions and plan.   [LM]    Clinical Course User Index [LM] Jeannie FendMurphy, Alizon Schmeling A, PA-C  Final Clinical Impressions(s) / ED Diagnoses   Final diagnoses:  Pelvic pain in female    ED Discharge Orders    None       Alden Hipp 04/29/19 1639    Benjiman Core, MD 04/30/19 1534

## 2019-04-29 NOTE — ED Triage Notes (Signed)
Suprapubic pain x1 week.  Urinary frequency but no dysuria.  Denies vaginal complaints.  Hurts more when changing positions or walking.

## 2019-04-30 LAB — GC/CHLAMYDIA PROBE AMP (~~LOC~~) NOT AT ARMC
Chlamydia: NEGATIVE
Neisseria Gonorrhea: NEGATIVE

## 2019-06-26 ENCOUNTER — Emergency Department (HOSPITAL_BASED_OUTPATIENT_CLINIC_OR_DEPARTMENT_OTHER)
Admission: EM | Admit: 2019-06-26 | Discharge: 2019-06-26 | Disposition: A | Discharge status: Home / Self Care | Payer: Self-pay | Attending: Emergency Medicine | Admitting: Emergency Medicine

## 2019-06-26 ENCOUNTER — Emergency Department (HOSPITAL_BASED_OUTPATIENT_CLINIC_OR_DEPARTMENT_OTHER): Payer: Self-pay

## 2019-06-26 ENCOUNTER — Encounter (HOSPITAL_BASED_OUTPATIENT_CLINIC_OR_DEPARTMENT_OTHER): Payer: Self-pay | Admitting: Emergency Medicine

## 2019-06-26 ENCOUNTER — Other Ambulatory Visit: Payer: Self-pay

## 2019-06-26 DIAGNOSIS — Y9301 Activity, walking, marching and hiking: Secondary | ICD-10-CM | POA: Insufficient documentation

## 2019-06-26 DIAGNOSIS — X501XXA Overexertion from prolonged static or awkward postures, initial encounter: Secondary | ICD-10-CM | POA: Insufficient documentation

## 2019-06-26 DIAGNOSIS — S82832A Other fracture of upper and lower end of left fibula, initial encounter for closed fracture: Secondary | ICD-10-CM | POA: Insufficient documentation

## 2019-06-26 DIAGNOSIS — Y998 Other external cause status: Secondary | ICD-10-CM | POA: Insufficient documentation

## 2019-06-26 DIAGNOSIS — Y929 Unspecified place or not applicable: Secondary | ICD-10-CM | POA: Insufficient documentation

## 2019-06-26 MED ORDER — ONDANSETRON 4 MG PO TBDP
4.0000 mg | ORAL_TABLET | Freq: Once | ORAL | Status: AC
  Administered 2019-06-26: 4 mg via ORAL

## 2019-06-26 MED ORDER — HYDROCODONE-ACETAMINOPHEN 5-325 MG PO TABS
2.0000 | ORAL_TABLET | Freq: Once | ORAL | Status: AC
  Administered 2019-06-26: 2 via ORAL
  Filled 2019-06-26: qty 2

## 2019-06-26 MED ORDER — IBUPROFEN 400 MG PO TABS
600.0000 mg | ORAL_TABLET | Freq: Once | ORAL | Status: AC
  Administered 2019-06-26: 600 mg via ORAL
  Filled 2019-06-26: qty 1

## 2019-06-26 MED ORDER — ONDANSETRON 4 MG PO TBDP
ORAL_TABLET | ORAL | Status: AC
  Filled 2019-06-26: qty 1

## 2019-06-26 MED ORDER — HYDROCODONE-ACETAMINOPHEN 5-325 MG PO TABS: 1.0000 | ORAL_TABLET | ORAL | 0 refills | Status: DC | PRN

## 2019-06-26 NOTE — ED Triage Notes (Signed)
She states she was walking today and twisted her L ankle.

## 2019-06-26 NOTE — ED Provider Notes (Signed)
[facility] EMERGENCY DEPARTMENT Provider Note   CSN: [id number] Arrival date & time: 06/26/19  1009    History   Chief Complaint Chief Complaint  Patient presents with  . Ankle Pain    HPI Cindy Hunter is a 28 y.o. female.     HPI  28 year old female with left ankle pain.  She thinks she sprained it.  Was walking and twisted while she missed a step.  No other injuries.  No weakness or numbness.  She is having some pain at her left lateral knee but states she bruised it earlier in the week.  Has not take anything for pain.  Pain is currently severe.  History reviewed. No pertinent past medical history.  There are no active problems to display for this patient.   Past Surgical History:  Procedure Laterality Date  . THERAPEUTIC ABORTION    . WISDOM TOOTH EXTRACTION       OB History    Gravida  5   Para  2   Term  2   Preterm  0   AB  3   Living  2     SAB  0   TAB  3   Ectopic  0   Multiple  0   Live Births  2            Home Medications    Prior to Admission medications   Medication Sig Start Date End Date Taking? Authorizing Provider  HYDROcodone-acetaminophen (NORCO) 5-325 MG tablet Take 1 tablet by mouth every 4 (four) hours as needed for severe pain. 06/26/19   Sherwood Gambler, MD    Family History Family History  Problem Relation Age of Onset  . Hypertension Father   . Hypertension Maternal Grandmother   . Arthritis Maternal Grandmother   . Diabetes Maternal Grandfather   . Hypertension Maternal Grandfather     Social History Social History   Tobacco Use  . Smoking status: Never Smoker  . Smokeless tobacco: Never Used  Substance Use Topics  . Alcohol use: No  . Drug use: No     Allergies   Penicillins and Phenergan [promethazine hcl]   Review of Systems Review of Systems  Musculoskeletal: Positive for arthralgias.  Neurological: Negative for weakness and numbness.     Physical Exam Updated Vital  Signs BP 128/87 (BP Location: Right Arm)   Pulse (!) 115   Temp 99.1 F (37.3 C) (Oral)   Resp 20   Ht 5\' 2"  (1.575 m)   Wt 81.6 kg   LMP 05/27/2019   SpO2 99%   BMI 32.92 kg/m   Physical Exam Vitals signs and nursing note reviewed.  Constitutional:      Appearance: She is well-developed.  HENT:     Head: Normocephalic and atraumatic.     Right Ear: External ear normal.     Left Ear: External ear normal.     Nose: Nose normal.  Eyes:     General:        Right eye: No discharge.        Left eye: No discharge.  Cardiovascular:     Rate and Rhythm: Normal rate and regular rhythm.     Pulses:          Dorsalis pedis pulses are 2+ on the right side and 2+ on the left side.  Pulmonary:     Effort: Pulmonary effort is normal.  Abdominal:     General: There is no distension.  Musculoskeletal:  Left knee: She exhibits normal range of motion and no swelling. Tenderness found. Lateral joint line tenderness noted.     Left ankle: She exhibits normal range of motion, no swelling and no deformity. No tenderness.     Left lower leg: She exhibits tenderness. She exhibits no deformity.       Legs:  Skin:    General: Skin is warm and dry.  Neurological:     Mental Status: She is alert.  Psychiatric:        Mood and Affect: Mood is not anxious.      ED Treatments / Results  Labs (all labs ordered are listed, but only abnormal results are displayed) Labs Reviewed - No data to display  EKG None  Radiology Dg Ankle Complete Left  Result Date: 06/26/2019 CLINICAL DATA:  Pain following rolling injury EXAM: LEFT ANKLE COMPLETE - 3+ VIEW COMPARISON:  None. FINDINGS: Frontal, oblique, and lateral views were obtained. There is a fracture of the distal fibular diaphysis, approximately 5 cm proximal to the lateral malleolus. There is slight lateral angulation distally. No other evident fracture. No joint effusion. The joint spaces appear normal. No erosive change. Ankle mortise  appears intact. IMPRESSION: Fracture distal fibular diaphysis with alignment near anatomic. Slight lateral angulation distally noted. No other fracture. No appreciable arthropathy. Ankle mortise appears intact. Electronically Signed   By: Bretta BangWilliam  Woodruff III M.D.   On: 06/26/2019 10:40   Dg Knee Complete 4 Views Left  Result Date: 06/26/2019 CLINICAL DATA:  Fall, pain at the fibular head. EXAM: LEFT KNEE - COMPLETE 4+ VIEW COMPARISON:  None. FINDINGS: No evidence of fracture, dislocation, or joint effusion. No evidence of arthropathy or other focal bone abnormality. Soft tissues are unremarkable. IMPRESSION: Negative. Electronically Signed   By: Duanne GuessNicholas  Plundo M.D.   On: 06/26/2019 11:27    Procedures Procedures (including critical care time)  Medications Ordered in ED Medications  ondansetron (ZOFRAN-ODT) disintegrating tablet 4 mg (has no administration in time range)  ibuprofen (ADVIL) tablet 600 mg (600 mg Oral Given 06/26/19 1049)  HYDROcodone-acetaminophen (NORCO/VICODIN) 5-325 MG per tablet 2 tablet (2 tablets Oral Given 06/26/19 1049)     Initial Impression / Assessment and Plan / ED Course  I have reviewed the triage vital signs and the nursing notes.  Pertinent labs & imaging results that were available during my care of the patient were reviewed by me and considered in my medical decision making (see chart for details).        Patient is neurovascular intact.  X-ray shows fibula fracture though not at the ankle joint.  Appears pretty well aligned.  Will splint, put in crutches, and follow-up with Ortho.  Hydrocodone and ibuprofen for pain.  Final Clinical Impressions(s) / ED Diagnoses   Final diagnoses:  Closed fracture of distal end of left fibula, unspecified fracture morphology, initial encounter    ED Discharge Orders         Ordered    HYDROcodone-acetaminophen (NORCO) 5-325 MG tablet  Every 4 hours PRN     06/26/19 1153           Pricilla LovelessGoldston, Raydan Schlabach, MD  06/26/19 1210

## 2019-06-26 NOTE — ED Notes (Signed)
Patient transported to X-ray 

## 2019-06-26 NOTE — ED Notes (Signed)
ED Provider at bedside. 

## 2019-08-16 ENCOUNTER — Other Ambulatory Visit: Payer: Self-pay

## 2019-08-16 ENCOUNTER — Emergency Department (HOSPITAL_BASED_OUTPATIENT_CLINIC_OR_DEPARTMENT_OTHER)
Admission: EM | Admit: 2019-08-16 | Discharge: 2019-08-16 | Disposition: A | Discharge status: Home / Self Care | Payer: Self-pay | Attending: Emergency Medicine | Admitting: Emergency Medicine

## 2019-08-16 ENCOUNTER — Encounter (HOSPITAL_BASED_OUTPATIENT_CLINIC_OR_DEPARTMENT_OTHER): Payer: Self-pay | Admitting: Emergency Medicine

## 2019-08-16 DIAGNOSIS — O210 Mild hyperemesis gravidarum: Secondary | ICD-10-CM | POA: Insufficient documentation

## 2019-08-16 DIAGNOSIS — Z3A00 Weeks of gestation of pregnancy not specified: Secondary | ICD-10-CM | POA: Insufficient documentation

## 2019-08-16 LAB — COMPREHENSIVE METABOLIC PANEL
ALT: 12 U/L (ref 0–44)
AST: 16 U/L (ref 15–41)
Albumin: 3.7 g/dL (ref 3.5–5.0)
Alkaline Phosphatase: 51 U/L (ref 38–126)
Anion gap: 11 (ref 5–15)
BUN: 9 mg/dL (ref 6–20)
CO2: 24 mmol/L (ref 22–32)
Calcium: 9 mg/dL (ref 8.9–10.3)
Chloride: 102 mmol/L (ref 98–111)
Creatinine, Ser: 0.73 mg/dL (ref 0.44–1.00)
GFR calc Af Amer: >60 mL/min (ref >60)
GFR calc non Af Amer: >60 mL/min (ref >60)
Glucose, Bld: 89 mg/dL (ref 70–99)
Potassium: 3.4 mmol/L — ABNORMAL LOW (ref 3.5–5.1)
Sodium: 137 mmol/L (ref 135–145)
Total Bilirubin: 0.4 mg/dL (ref 0.3–1.2)
Total Protein: 7.9 g/dL (ref 6.5–8.1)

## 2019-08-16 LAB — CBC WITH DIFFERENTIAL/PLATELET
Abs Immature Granulocytes: 0.02 10*3/uL (ref 0.00–0.07)
Basophils Absolute: 0 10*3/uL (ref 0.0–0.1)
Basophils Relative: 0 %
Eosinophils Absolute: 0.1 10*3/uL (ref 0.0–0.5)
Eosinophils Relative: 1 %
HCT: 39.7 % (ref 36.0–46.0)
Hemoglobin: 12.5 g/dL (ref 12.0–15.0)
Immature Granulocytes: 0 %
Lymphocytes Relative: 20 %
Lymphs Abs: 2.2 10*3/uL (ref 0.7–4.0)
MCH: 26.2 pg (ref 26.0–34.0)
MCHC: 31.5 g/dL (ref 30.0–36.0)
MCV: 83.1 fL (ref 80.0–100.0)
Monocytes Absolute: 0.9 10*3/uL (ref 0.1–1.0)
Monocytes Relative: 8 %
Neutro Abs: 7.7 10*3/uL (ref 1.7–7.7)
Neutrophils Relative %: 71 %
Platelets: 294 10*3/uL (ref 150–400)
RBC: 4.78 MIL/uL (ref 3.87–5.11)
RDW: 14.5 % (ref 11.5–15.5)
WBC: 10.9 10*3/uL — ABNORMAL HIGH (ref 4.0–10.5)
nRBC: 0 % (ref 0.0–0.2)

## 2019-08-16 LAB — HCG, QUANTITATIVE, PREGNANCY: hCG, Beta Chain, Quant, S: 128464 m[IU]/mL — ABNORMAL HIGH (ref <5)

## 2019-08-16 MED ORDER — METOCLOPRAMIDE HCL 10 MG PO TABS: 10.0000 mg | ORAL_TABLET | Freq: Four times a day (QID) | ORAL | 0 refills | Status: DC | PRN

## 2019-08-16 MED ORDER — ONDANSETRON 4 MG PO TBDP
4.0000 mg | ORAL_TABLET | Freq: Once | ORAL | Status: AC
  Administered 2019-08-16: 08:00:00 4 mg via ORAL
  Filled 2019-08-16: qty 1

## 2019-08-16 MED ORDER — METOCLOPRAMIDE HCL 10 MG PO TABS
10.0000 mg | ORAL_TABLET | Freq: Once | ORAL | Status: AC
  Administered 2019-08-16: 10 mg via ORAL
  Filled 2019-08-16: qty 1

## 2019-08-16 MED ORDER — METOCLOPRAMIDE HCL 5 MG/ML IJ SOLN
10.0000 mg | Freq: Once | INTRAMUSCULAR | Status: AC
  Administered 2019-08-16: 10 mg via INTRAVENOUS
  Filled 2019-08-16: qty 2

## 2019-08-16 MED ORDER — SODIUM CHLORIDE 0.9 % IV BOLUS
1000.0000 mL | Freq: Once | INTRAVENOUS | Status: AC
  Administered 2019-08-16: 1000 mL via INTRAVENOUS

## 2019-08-16 NOTE — ED Notes (Signed)
Pt states that nausea is still not improved.

## 2019-08-16 NOTE — ED Notes (Signed)
States she threw up liquid.

## 2019-08-16 NOTE — ED Triage Notes (Signed)
Pt just found out she was pregnant and predicts she's about [redacted] weeks along. She has had constant nausea and emesis x 1 week.

## 2019-08-16 NOTE — ED Provider Notes (Signed)
[facility] EMERGENCY DEPARTMENT Provider Note   CSN: [id number] Arrival date & time: 08/16/19  0706     History   Chief Complaint Chief Complaint  Patient presents with  . Emesis    HPI Cindy Hunter is a 28 y.o. female.     HPI Patient is G4 P2.  Last week she took a home pregnancy test which was positive.  Her last menstrual period was the end of August.  For the past week she is had persistent vomiting.  Vomit is nonbilious and nonbloody.  She denies any diarrhea.  No abdominal pain.  No fever or chills.  Patient denies vaginal bleeding or discharge.  No urinary symptoms.  Has not seen an OB/GYN this pregnancy.   History reviewed. No pertinent past medical history.  There are no active problems to display for this patient.   Past Surgical History:  Procedure Laterality Date  . THERAPEUTIC ABORTION    . WISDOM TOOTH EXTRACTION       OB History    Gravida  6   Para  2   Term  2   Preterm  0   AB  3   Living  2     SAB  0   TAB  3   Ectopic  0   Multiple  0   Live Births  2            Home Medications    Prior to Admission medications   Medication Sig Start Date End Date Taking? Authorizing Provider  HYDROcodone-acetaminophen (NORCO) 5-325 MG tablet Take 1 tablet by mouth every 4 (four) hours as needed for severe pain. 06/26/19   Pricilla LovelessGoldston, Scott, MD  metoCLOPramide (REGLAN) 10 MG tablet Take 1 tablet (10 mg total) by mouth every 6 (six) hours as needed for nausea (nausea/headache). 08/16/19   Loren RacerYelverton, Darel Ricketts, MD    Family History Family History  Problem Relation Age of Onset  . Hypertension Father   . Hypertension Maternal Grandmother   . Arthritis Maternal Grandmother   . Diabetes Maternal Grandfather   . Hypertension Maternal Grandfather     Social History Social History   Tobacco Use  . Smoking status: Never Smoker  . Smokeless tobacco: Never Used  Substance Use Topics  . Alcohol use: No  . Drug use: No      Allergies   Penicillins and Phenergan [promethazine hcl]   Review of Systems Review of Systems  Constitutional: Negative for chills and fever.  HENT: Negative for sore throat and trouble swallowing.   Gastrointestinal: Positive for nausea and vomiting. Negative for abdominal pain, constipation and diarrhea.  Genitourinary: Negative for dysuria, flank pain, frequency, pelvic pain, vaginal bleeding and vaginal discharge.  Musculoskeletal: Negative for back pain and myalgias.  Skin: Negative for rash and wound.  Neurological: Negative for dizziness and light-headedness.  All other systems reviewed and are negative.    Physical Exam Updated Vital Signs BP 117/74   Pulse 84   Temp 98.4 F (36.9 C) (Oral)   Resp 16   Ht 5\' 2"  (1.575 m)   Wt 81.6 kg   LMP 07/16/2019 (Approximate)   SpO2 100%   Breastfeeding No   BMI 32.92 kg/m   Physical Exam Vitals signs and nursing note reviewed.  Constitutional:      Appearance: She is well-developed.  HENT:     Head: Normocephalic and atraumatic.     Mouth/Throat:     Mouth: Mucous membranes are moist.  Eyes:  Pupils: Pupils are equal, round, and reactive to light.  Neck:     Musculoskeletal: Normal range of motion and neck supple.  Cardiovascular:     Rate and Rhythm: Normal rate and regular rhythm.  Pulmonary:     Effort: Pulmonary effort is normal.     Breath sounds: Normal breath sounds.  Abdominal:     General: Bowel sounds are normal.     Palpations: Abdomen is soft.     Tenderness: There is no abdominal tenderness. There is no guarding or rebound.  Musculoskeletal: Normal range of motion.        General: No tenderness.  Skin:    General: Skin is warm and dry.     Capillary Refill: Capillary refill takes less than 2 seconds.     Findings: No erythema or rash.  Neurological:     General: No focal deficit present.     Mental Status: She is alert and oriented to person, place, and time.  Psychiatric:        Behavior:  Behavior normal.      ED Treatments / Results  Labs (all labs ordered are listed, but only abnormal results are displayed) Labs Reviewed  CBC WITH DIFFERENTIAL/PLATELET - Abnormal; Notable for the following components:      Result Value   WBC 10.9 (*)    All other components within normal limits  COMPREHENSIVE METABOLIC PANEL - Abnormal; Notable for the following components:   Potassium 3.4 (*)    All other components within normal limits  HCG, QUANTITATIVE, PREGNANCY - Abnormal; Notable for the following components:   hCG, Beta Chain, Quant, [place] 128,464 (*)    All other components within normal limits    EKG None  Radiology No results found.  Procedures Procedures (including critical care time)  Medications Ordered in ED Medications  ondansetron (ZOFRAN-ODT) disintegrating tablet 4 mg (4 mg Oral Given 08/16/19 0734)  metoCLOPramide (REGLAN) tablet 10 mg (10 mg Oral Given 08/16/19 0956)  sodium chloride 0.9 % bolus 1,000 mL (0 mLs Intravenous Stopped 08/16/19 1246)  metoCLOPramide (REGLAN) injection 10 mg (10 mg Intravenous Given 08/16/19 1149)     Initial Impression / Assessment and Plan / ED Course  I have reviewed the triage vital signs and the nursing notes.  Pertinent labs & imaging results that were available during my care of the patient were reviewed by me and considered in my medical decision making (see chart for details).        Patient is well-appearing.  No evidence of dehydration.  Abdominal exam is benign.  Will give oral antiemetic and fluid challenge.  Encouraged to start prenatal vitamin and follow-up with an OB Patient failed fluid challenge after oral antiemetic.  IV started and given IV Reglan and IV fluids.  Patient states she is feeling much better.  Tolerating oral intake.  Encouraged to follow-up with her OB/GYN.  Abdomen remains soft and nontender.  Return precautions given. Final Clinical Impressions(s) / ED Diagnoses   Final diagnoses:   Hyperemesis gravidarum    ED Discharge Orders         Ordered    metoCLOPramide (REGLAN) 10 MG tablet  Every 6 hours PRN     08/16/19 1253           Julianne Rice, MD 08/16/19 1254

## 2020-10-11 ENCOUNTER — Emergency Department (HOSPITAL_BASED_OUTPATIENT_CLINIC_OR_DEPARTMENT_OTHER)
Admission: EM | Admit: 2020-10-11 | Discharge: 2020-10-11 | Disposition: A | Discharge status: Home / Self Care | Payer: Medicaid Other | Attending: Emergency Medicine | Admitting: Emergency Medicine

## 2020-10-11 ENCOUNTER — Emergency Department (HOSPITAL_BASED_OUTPATIENT_CLINIC_OR_DEPARTMENT_OTHER): Payer: Medicaid Other

## 2020-10-11 ENCOUNTER — Other Ambulatory Visit: Payer: Self-pay

## 2020-10-11 ENCOUNTER — Encounter (HOSPITAL_BASED_OUTPATIENT_CLINIC_OR_DEPARTMENT_OTHER): Payer: Self-pay | Admitting: Emergency Medicine

## 2020-10-11 ENCOUNTER — Other Ambulatory Visit (HOSPITAL_BASED_OUTPATIENT_CLINIC_OR_DEPARTMENT_OTHER): Payer: Self-pay | Admitting: Emergency Medicine

## 2020-10-11 DIAGNOSIS — M545 Low back pain, unspecified: Secondary | ICD-10-CM

## 2020-10-11 DIAGNOSIS — M542 Cervicalgia: Secondary | ICD-10-CM | POA: Insufficient documentation

## 2020-10-11 DIAGNOSIS — M546 Pain in thoracic spine: Secondary | ICD-10-CM | POA: Diagnosis present

## 2020-10-11 MED ORDER — IBUPROFEN 800 MG PO TABS
800.0000 mg | ORAL_TABLET | Freq: Once | ORAL | Status: AC
  Administered 2020-10-11: 800 mg via ORAL
  Filled 2020-10-11: qty 1

## 2020-10-11 MED ORDER — METHOCARBAMOL 500 MG PO TABS: 500.0000 mg | ORAL_TABLET | Freq: Four times a day (QID) | ORAL | 0 refills | Status: DC | PRN

## 2020-10-11 MED ORDER — IBUPROFEN 800 MG PO TABS: 800.0000 mg | ORAL_TABLET | Freq: Three times a day (TID) | ORAL | 0 refills | Status: DC | PRN

## 2020-10-11 MED FILL — METHOCARBAMOL 500 MG TABS: 500 | 5 days supply | Qty: 20 | Fill #0

## 2020-10-11 MED FILL — IBUPROFEN 800 MG TAB: 800 | 7 days supply | Qty: 21 | Fill #0
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
## 2021-03-18 ENCOUNTER — Emergency Department (HOSPITAL_BASED_OUTPATIENT_CLINIC_OR_DEPARTMENT_OTHER): Payer: Medicaid Other

## 2021-03-18 ENCOUNTER — Other Ambulatory Visit: Payer: Self-pay

## 2021-03-18 ENCOUNTER — Encounter (HOSPITAL_BASED_OUTPATIENT_CLINIC_OR_DEPARTMENT_OTHER): Payer: Self-pay | Admitting: Emergency Medicine

## 2021-03-18 ENCOUNTER — Emergency Department (HOSPITAL_BASED_OUTPATIENT_CLINIC_OR_DEPARTMENT_OTHER)
Admission: EM | Admit: 2021-03-18 | Discharge: 2021-03-18 | Disposition: A | Discharge status: Home / Self Care | Payer: Medicaid Other | Attending: Emergency Medicine | Admitting: Emergency Medicine

## 2021-03-18 DIAGNOSIS — S0083XA Contusion of other part of head, initial encounter: Secondary | ICD-10-CM

## 2021-03-18 DIAGNOSIS — S00532A Contusion of oral cavity, initial encounter: Secondary | ICD-10-CM | POA: Diagnosis not present

## 2021-03-18 DIAGNOSIS — S0993XA Unspecified injury of face, initial encounter: Secondary | ICD-10-CM | POA: Diagnosis present

## 2021-03-18 MED ORDER — HYDROCODONE-ACETAMINOPHEN 5-325 MG PO TABS
1.0000 | ORAL_TABLET | Freq: Once | ORAL | Status: AC
  Administered 2021-03-18: 1 via ORAL
  Filled 2021-03-18: qty 1
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
## 2021-03-18 NOTE — ED Notes (Signed)
Pt requesting pain meds 10/10
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
## 2021-05-14 ENCOUNTER — Encounter (HOSPITAL_BASED_OUTPATIENT_CLINIC_OR_DEPARTMENT_OTHER): Payer: Self-pay | Admitting: Emergency Medicine

## 2021-05-14 ENCOUNTER — Other Ambulatory Visit: Payer: Self-pay

## 2021-05-14 ENCOUNTER — Emergency Department (HOSPITAL_BASED_OUTPATIENT_CLINIC_OR_DEPARTMENT_OTHER)
Admission: EM | Admit: 2021-05-14 | Discharge: 2021-05-14 | Disposition: A | Discharge status: Home / Self Care | Payer: Medicaid Other | Attending: Emergency Medicine | Admitting: Emergency Medicine

## 2021-05-14 DIAGNOSIS — R112 Nausea with vomiting, unspecified: Secondary | ICD-10-CM | POA: Diagnosis not present

## 2021-05-14 DIAGNOSIS — R197 Diarrhea, unspecified: Secondary | ICD-10-CM

## 2021-05-14 DIAGNOSIS — R1033 Periumbilical pain: Secondary | ICD-10-CM | POA: Diagnosis present

## 2021-05-14 DIAGNOSIS — Z20822 Contact with and (suspected) exposure to covid-19: Secondary | ICD-10-CM | POA: Diagnosis not present

## 2021-05-14 LAB — URINALYSIS, ROUTINE W REFLEX MICROSCOPIC
Bilirubin Urine: NEGATIVE
Glucose, UA: NEGATIVE mg/dL
Hgb urine dipstick: NEGATIVE
Ketones, ur: 15 mg/dL — AB
Leukocytes,Ua: NEGATIVE
Nitrite: NEGATIVE
Protein, ur: NEGATIVE mg/dL
Specific Gravity, Urine: 1.025 (ref 1.005–1.030)
pH: 6 (ref 5.0–8.0)

## 2021-05-14 LAB — CBC WITH DIFFERENTIAL/PLATELET
Abs Immature Granulocytes: 0.02 10*3/uL (ref 0.00–0.07)
Basophils Absolute: 0 10*3/uL (ref 0.0–0.1)
Basophils Relative: 0 %
Eosinophils Absolute: 0.1 10*3/uL (ref 0.0–0.5)
Eosinophils Relative: 1 %
HCT: 40.1 % (ref 36.0–46.0)
Hemoglobin: 12.9 g/dL (ref 12.0–15.0)
Immature Granulocytes: 0 %
Lymphocytes Relative: 31 %
Lymphs Abs: 2.2 10*3/uL (ref 0.7–4.0)
MCH: 26.5 pg (ref 26.0–34.0)
MCHC: 32.2 g/dL (ref 30.0–36.0)
MCV: 82.5 fL (ref 80.0–100.0)
Monocytes Absolute: 0.6 10*3/uL (ref 0.1–1.0)
Monocytes Relative: 9 %
Neutro Abs: 4.1 10*3/uL (ref 1.7–7.7)
Neutrophils Relative %: 59 %
Platelets: 292 10*3/uL (ref 150–400)
RBC: 4.86 MIL/uL (ref 3.87–5.11)
RDW: 15.4 % (ref 11.5–15.5)
WBC: 7 10*3/uL (ref 4.0–10.5)
nRBC: 0 % (ref 0.0–0.2)

## 2021-05-14 LAB — COMPREHENSIVE METABOLIC PANEL
ALT: 15 U/L (ref 0–44)
AST: 18 U/L (ref 15–41)
Albumin: 4.1 g/dL (ref 3.5–5.0)
Alkaline Phosphatase: 55 U/L (ref 38–126)
Anion gap: 8 (ref 5–15)
BUN: 14 mg/dL (ref 6–20)
CO2: 27 mmol/L (ref 22–32)
Calcium: 8.9 mg/dL (ref 8.9–10.3)
Chloride: 103 mmol/L (ref 98–111)
Creatinine, Ser: 0.88 mg/dL (ref 0.44–1.00)
GFR, Estimated: >60 mL/min (ref >60)
Glucose, Bld: 90 mg/dL (ref 70–99)
Potassium: 3.6 mmol/L (ref 3.5–5.1)
Sodium: 138 mmol/L (ref 135–145)
Total Bilirubin: 0.7 mg/dL (ref 0.3–1.2)
Total Protein: 7.9 g/dL (ref 6.5–8.1)

## 2021-05-14 LAB — PREGNANCY, URINE: Preg Test, Ur: NEGATIVE

## 2021-05-14 LAB — RESP PANEL BY RT-PCR (FLU A&B, COVID) ARPGX2
Influenza A by PCR: NEGATIVE
Influenza B by PCR: NEGATIVE
SARS Coronavirus 2 by RT PCR: NEGATIVE

## 2021-05-14 LAB — LIPASE, BLOOD: Lipase: 25 U/L (ref 11–51)

## 2021-05-14 MED ORDER — ONDANSETRON 4 MG PO TBDP: 4.0000 mg | ORAL_TABLET | Freq: Three times a day (TID) | ORAL | 0 refills | Status: DC | PRN

## 2021-05-14 MED ORDER — DICYCLOMINE HCL 20 MG PO TABS: 20.0000 mg | ORAL_TABLET | Freq: Two times a day (BID) | ORAL | 0 refills | Status: DC

## 2021-05-14 MED ORDER — DICYCLOMINE HCL 10 MG PO CAPS
10.0000 mg | ORAL_CAPSULE | Freq: Once | ORAL | Status: AC
  Administered 2021-05-14: 10 mg via ORAL
  Filled 2021-05-14: qty 1

## 2021-05-14 MED ORDER — SODIUM CHLORIDE 0.9 % IV BOLUS
1000.0000 mL | Freq: Once | INTRAVENOUS | Status: AC
  Administered 2021-05-14: 1000 mL via INTRAVENOUS

## 2021-05-14 NOTE — ED Triage Notes (Signed)
Pt arrives pov with c/o lower abdominal pain, N/V/D x 3 days. Pt denies fever

## 2021-05-14 NOTE — Discharge Instructions (Signed)
You were evaluated in the Emergency Department and after careful evaluation, we did not find any emergent condition requiring admission or further testing in the hospital.  Please take Bentyl for abdominal pain, Zofran for nausea as needed.  Continue to drink plenty of fluids.  Please return to the Emergency Department if you experience any worsening of your condition.  We encourage you to follow up with a primary care provider.  Thank you for allowing [place] to be a part of your care.

## 2021-05-14 NOTE — ED Provider Notes (Signed)
[facility] EMERGENCY DEPARTMENT Provider Note   CSN: [id number] Arrival date & time: 05/14/21  1041     History Chief Complaint  Patient presents with  . Abdominal Pain    Cindy Hunter is a 30 y.o. female.  HPI 30 year old female with history of multiple tooth extractions, therapeutic abortion, presents to the ER with complaints of mid abdominal pain, nausea, vomiting and diarrhea over the last 2 days.  Patient states that she not drinking heavily on Thursday, and "thinks someone poisoned her".  She has had several episodes of nonbloody nonbilious vomiting and watery diarrhea.  She endorses some umbilical pain that comes and goes.  No known fevers or chills.  No dysuria or hematuria.  No vaginal bleeding or discharge.  No pelvic pain.  No chest pain, shortness of breath.    History reviewed. No pertinent past medical history.  There are no problems to display for this patient.   Past Surgical History:  Procedure Laterality Date  . MULTIPLE TOOTH EXTRACTIONS    . THERAPEUTIC ABORTION    . WISDOM TOOTH EXTRACTION       OB History    Gravida  6   Para  2   Term  2   Preterm  0   AB  3   Living  2     SAB  0   IAB  3   Ectopic  0   Multiple  0   Live Births  2           Family History  Problem Relation Age of Onset  . Hypertension Father   . Hypertension Maternal Grandmother   . Arthritis Maternal Grandmother   . Diabetes Maternal Grandfather   . Hypertension Maternal Grandfather     Social History   Tobacco Use  . Smoking status: Never Smoker  . Smokeless tobacco: Never Used  Vaping Use  . Vaping Use: Never used  Substance Use Topics  . Alcohol use: Not Currently  . Drug use: No    Home Medications Prior to Admission medications   Medication Sig Start Date End Date Taking? Authorizing Provider  dicyclomine (BENTYL) 20 MG tablet Take 1 tablet (20 mg total) by mouth 2 (two) times daily. 05/14/21  Yes Trudee Grip A, PA-C   ondansetron (ZOFRAN ODT) 4 MG disintegrating tablet Take 1 tablet (4 mg total) by mouth every 8 (eight) hours as needed for nausea or vomiting. 05/14/21  Yes Mare Ferrari, PA-C  HYDROcodone-acetaminophen (NORCO) 5-325 MG tablet Take 1 tablet by mouth every 4 (four) hours as needed for severe pain. 06/26/19   Pricilla Loveless, MD  ibuprofen (ADVIL) 800 MG tablet TAKE 1 TABLET (800 MG TOTAL) BY MOUTH EVERY 8 (EIGHT) HOURS AS NEEDED. 10/11/20 10/11/21  Terrilee Files, MD  methocarbamol (ROBAXIN) 500 MG tablet TAKE 1 TABLET (500 MG TOTAL) BY MOUTH EVERY 6 (SIX) HOURS AS NEEDED FOR MUSCLE SPASMS. 10/11/20 10/11/21  Terrilee Files, MD  metoCLOPramide (REGLAN) 10 MG tablet Take 1 tablet (10 mg total) by mouth every 6 (six) hours as needed for nausea (nausea/headache). 08/16/19   Loren Racer, MD    Allergies    Penicillins, Phenergan [promethazine hcl], and Promethazine  Review of Systems   Review of Systems  Constitutional: Negative for chills and fever.  HENT: Negative for ear pain and sore throat.   Eyes: Negative for pain and visual disturbance.  Respiratory: Negative for cough and shortness of breath.   Cardiovascular: Negative for chest pain  and palpitations.  Gastrointestinal: Positive for abdominal pain, diarrhea, nausea and vomiting.  Genitourinary: Negative for dysuria and hematuria.  Musculoskeletal: Negative for arthralgias and back pain.  Skin: Negative for color change and rash.  Neurological: Negative for seizures and syncope.  All other systems reviewed and are negative.   Physical Exam Updated Vital Signs BP 116/75 (BP Location: Left Arm)   Pulse 70   Temp 98.6 F (37 C) (Oral)   Resp 18   Ht 5\' 2"  (1.575 m)   Wt 102.1 kg   LMP 05/07/2021   SpO2 100%   BMI 41.15 kg/m   Physical Exam Vitals and nursing note reviewed.  Constitutional:      General: She is not in acute distress.    Appearance: She is well-developed. She is not ill-appearing or diaphoretic.   HENT:     Head: Normocephalic and atraumatic.  Eyes:     Conjunctiva/sclera: Conjunctivae normal.  Cardiovascular:     Rate and Rhythm: Normal rate and regular rhythm.     Heart sounds: No murmur heard.   Pulmonary:     Effort: Pulmonary effort is normal. No respiratory distress.     Breath sounds: Normal breath sounds.  Abdominal:     General: Abdomen is flat.     Palpations: Abdomen is soft.     Tenderness: There is no abdominal tenderness.     Hernia: No hernia is present.     Comments: Minimal periumbilical tenderness, no guarding, no peritoneal signs, no focal tenderness, negative Murphy's, negative McBurney's  Musculoskeletal:     Cervical back: Neck supple.  Skin:    General: Skin is warm and dry.  Neurological:     Mental Status: She is alert.     ED Results / Procedures / Treatments   Labs (all labs ordered are listed, but only abnormal results are displayed) Labs Reviewed  URINALYSIS, ROUTINE W REFLEX MICROSCOPIC - Abnormal; Notable for the following components:      Result Value   Ketones, ur 15 (*)    All other components within normal limits  RESP PANEL BY RT-PCR (FLU A&B, COVID) ARPGX2  CBC WITH DIFFERENTIAL/PLATELET  COMPREHENSIVE METABOLIC PANEL  LIPASE, BLOOD  PREGNANCY, URINE    EKG None  Radiology No results found.  Procedures Procedures   Medications Ordered in ED Medications  sodium chloride 0.9 % bolus 1,000 mL (0 mLs Intravenous Stopped 05/14/21 1229)  dicyclomine (BENTYL) capsule 10 mg (10 mg Oral Given 05/14/21 1232)    ED Course  I have reviewed the triage vital signs and the nursing notes.  Pertinent labs & imaging results that were available during my care of the patient were reviewed by me and considered in my medical decision making (see chart for details).    MDM Rules/Calculators/A&P                          30 year old female presents to the ER with complaints of nausea and vomiting, diarrhea for the last 2 days.  On  arrival, she is well-appearing, no acute distress, resting comfortably in ER bed.  Vitals overall reassuring.  Physical exam with minimal periumbilical tenderness, no focal tenderness, no peritoneal signs, no guarding.  No CVA tenderness.  DDx includes viral gastroenteritis, COVID-19, cholecystitis, appendicitis, diverticulitis, Mallory-Weiss tear  Labs and imaging ordered, reviewed and interpreted by me.  CBC and CMP unremarkable, normal lipase.  COVID is negative.  Pregnancy is negative.  UA with evidence of  ketones.  Patient received 1 L of fluids, at 1 point stated that the pain had returned, but again with minimal tenderness.  She received Bentyl with resolution of her symptoms.  Low suspicion for surgical abdomen at this time.  Had a shared decision-making conversation with the patient who agrees that CT imaging is not needed at this time.  Will send home with Bentyl, Zofran.  Encouraged fluid resuscitation.  We discussed return precautions and PCP follow-up.  She voiced her standing and is agreeable.  Stable for discharge.   Final Clinical Impression(s) / ED Diagnoses Final diagnoses:  Nausea vomiting and diarrhea    Rx / DC Orders ED Discharge Orders         Ordered    dicyclomine (BENTYL) 20 MG tablet  2 times daily        05/14/21 1322    ondansetron (ZOFRAN ODT) 4 MG disintegrating tablet  Every 8 hours PRN        05/14/21 1322           Leone Brand 05/14/21 1323    Cheryll Cockayne, MD 05/20/21 2315

## 2021-05-14 NOTE — ED Notes (Signed)
Patient complaining of increasing abdominal pain, now 10/10.  PA notified, new order for Bentyl received.

## 2021-09-30 DIAGNOSIS — A749 Chlamydial infection, unspecified: Secondary | ICD-10-CM | POA: Insufficient documentation

## 2022-04-26 ENCOUNTER — Inpatient Hospital Stay (HOSPITAL_COMMUNITY): Payer: Medicaid Other

## 2022-04-26 ENCOUNTER — Inpatient Hospital Stay (HOSPITAL_COMMUNITY)
Admission: AD | Admit: 2022-04-26 | Discharge: 2022-04-26 | Disposition: A | Discharge status: Home / Self Care | Payer: Medicaid Other | Attending: Obstetrics & Gynecology | Admitting: Obstetrics & Gynecology

## 2022-04-26 ENCOUNTER — Encounter (HOSPITAL_COMMUNITY): Payer: Self-pay | Admitting: *Deleted

## 2022-04-26 DIAGNOSIS — O418X10 Other specified disorders of amniotic fluid and membranes, first trimester, not applicable or unspecified: Secondary | ICD-10-CM | POA: Diagnosis not present

## 2022-04-26 DIAGNOSIS — O26891 Other specified pregnancy related conditions, first trimester: Secondary | ICD-10-CM | POA: Insufficient documentation

## 2022-04-26 DIAGNOSIS — O208 Other hemorrhage in early pregnancy: Secondary | ICD-10-CM | POA: Insufficient documentation

## 2022-04-26 DIAGNOSIS — O468X1 Other antepartum hemorrhage, first trimester: Secondary | ICD-10-CM

## 2022-04-26 DIAGNOSIS — Z3A01 Less than 8 weeks gestation of pregnancy: Secondary | ICD-10-CM | POA: Diagnosis not present

## 2022-04-26 DIAGNOSIS — R109 Unspecified abdominal pain: Secondary | ICD-10-CM | POA: Diagnosis not present

## 2022-04-26 HISTORY — DX: Other specified health status: Z78.9

## 2022-04-26 LAB — URINALYSIS, ROUTINE W REFLEX MICROSCOPIC
Bilirubin Urine: NEGATIVE
Glucose, UA: NEGATIVE mg/dL
Ketones, ur: NEGATIVE mg/dL
Leukocytes,Ua: NEGATIVE
Nitrite: NEGATIVE
Protein, ur: NEGATIVE mg/dL
Specific Gravity, Urine: 1.018 (ref 1.005–1.030)
pH: 7 (ref 5.0–8.0)

## 2022-04-26 LAB — WET PREP, GENITAL
Sperm: NONE SEEN
Trich, Wet Prep: NONE SEEN
WBC, Wet Prep HPF POC: >=10 — AB (ref <10)
Yeast Wet Prep HPF POC: NONE SEEN

## 2022-04-26 LAB — HCG, QUANTITATIVE, PREGNANCY: hCG, Beta Chain, Quant, S: 30114 m[IU]/mL — ABNORMAL HIGH (ref <5)

## 2022-04-26 LAB — CBC
HCT: 38.9 % (ref 36.0–46.0)
Hemoglobin: 12.9 g/dL (ref 12.0–15.0)
MCH: 27.8 pg (ref 26.0–34.0)
MCHC: 33.2 g/dL (ref 30.0–36.0)
MCV: 83.8 fL (ref 80.0–100.0)
Platelets: 278 10*3/uL (ref 150–400)
RBC: 4.64 MIL/uL (ref 3.87–5.11)
RDW: 14.3 % (ref 11.5–15.5)
WBC: 9.1 10*3/uL (ref 4.0–10.5)
nRBC: 0 % (ref 0.0–0.2)

## 2022-04-26 LAB — ABO/RH: ABO/RH(D): O POS

## 2022-04-26 LAB — POCT PREGNANCY, URINE: Preg Test, Ur: POSITIVE — AB

## 2022-04-26 NOTE — MAU Provider Note (Signed)
History     [id number]  Arrival date and time: 04/26/22 1023    Chief Complaint  Patient presents with   Abdominal Pain   Vaginal Bleeding   Nausea     HPI Cindy Hunter is a 31 y.o. at [id number] by LMP who presents for vaginal bleeding. Symptoms started this morning around 1am. Woke up with vaginal bleeding that saturated her pajamas. Since then continued to see blood & some clots. Bleeding has continued to slow down & on arrival to MAU she reports no blood currently. Has had some intermittent abdominal cramping in her left lower quadrant. Had positive HPT last week. Had IUD placed in December but was removed 3 weeks later.  Denies fever, dysuria, or vaginal discharge.    OB History     Gravida  6   Para  2   Term  2   Preterm  0   AB  3   Living  2      SAB  0   IAB  3   Ectopic  0   Multiple  0   Live Births  2           Past Medical History:  Diagnosis Date   Medical history non-contributory     Past Surgical History:  Procedure Laterality Date   MULTIPLE TOOTH EXTRACTIONS     THERAPEUTIC ABORTION     WISDOM TOOTH EXTRACTION      Family History  Problem Relation Age of Onset   Healthy Mother    Hypertension Father    Hypertension Maternal Grandmother    Arthritis Maternal Grandmother    Diabetes Maternal Grandfather    Hypertension Maternal Grandfather     Allergies  Allergen Reactions   Penicillins Hives and Rash   Phenergan [Promethazine Hcl] Nausea And Vomiting   Promethazine Nausea And Vomiting    Unknown     No current facility-administered medications on file prior to encounter.   Current Outpatient Medications on File Prior to Encounter  Medication Sig Dispense Refill   MIRENA, 52 MG, 20 MCG/DAY IUD SMARTSIG:Vaginal       ROS Pertinent positives and negative per HPI, all others reviewed and negative  Physical Exam   BP 119/68 (BP Location: Right Arm)   Pulse 91   Temp 98.2 F (36.8 C) (Oral)   Resp 16   LMP  03/07/2022   Patient Vitals for the past 24 hrs:  BP Temp Temp src Pulse Resp  04/26/22 1049 119/68 98.2 F (36.8 C) Oral 91 16    Physical Exam Vitals and nursing note reviewed. Exam conducted with a chaperone present.  Constitutional:      General: She is not in acute distress.    Appearance: She is well-developed.  HENT:     Head: Normocephalic and atraumatic.  Pulmonary:     Effort: Pulmonary effort is normal. No respiratory distress.  Abdominal:     General: Abdomen is flat.     Palpations: Abdomen is soft.     Tenderness: There is no abdominal tenderness. There is no guarding or rebound.  Genitourinary:    General: Normal vulva.     Exam position: Lithotomy position.     Vagina: No vaginal discharge.     Cervix: Cervical bleeding (scant amount of dark red blood) present. No cervical motion tenderness, discharge or friability.  Skin:    General: Skin is warm and dry.  Neurological:     Mental Status: She is alert.  Labs Results for orders placed or performed during the hospital encounter of 04/26/22 (from the past 24 hour(s))  Pregnancy, urine POC     Status: Abnormal   Collection Time: 04/26/22 10:39 AM  Result Value Ref Range   Preg Test, Ur POSITIVE (A) NEGATIVE  Urinalysis, Routine w reflex microscopic Urine, Clean Catch     Status: Abnormal   Collection Time: 04/26/22 10:41 AM  Result Value Ref Range   Color, Urine YELLOW YELLOW   APPearance HAZY (A) CLEAR   Specific Gravity, Urine 1.018 1.005 - 1.030   pH 7.0 5.0 - 8.0   Glucose, UA NEGATIVE NEGATIVE mg/dL   Hgb urine dipstick SMALL (A) NEGATIVE   Bilirubin Urine NEGATIVE NEGATIVE   Ketones, ur NEGATIVE NEGATIVE mg/dL   Protein, ur NEGATIVE NEGATIVE mg/dL   Nitrite NEGATIVE NEGATIVE   Leukocytes,Ua NEGATIVE NEGATIVE   WBC, UA 0-5 0 - 5 WBC/hpf   Bacteria, UA RARE (A) NONE SEEN   Squamous Epithelial / LPF 6-10 0 - 5   Mucus PRESENT   CBC     Status: None   Collection Time: 04/26/22 11:21 AM   Result Value Ref Range   WBC 9.1 4.0 - 10.5 K/uL   RBC 4.64 3.87 - 5.11 MIL/uL   Hemoglobin 12.9 12.0 - 15.0 g/dL   HCT 27.7 82.4 - 23.5 %   MCV 83.8 80.0 - 100.0 fL   MCH 27.8 26.0 - 34.0 pg   MCHC 33.2 30.0 - 36.0 g/dL   RDW 36.1 44.3 - 15.4 %   Platelets 278 150 - 400 K/uL   nRBC 0.0 0.0 - 0.2 %  ABO/Rh     Status: None   Collection Time: 04/26/22 11:21 AM  Result Value Ref Range   ABO/RH(D) O POS    No rh immune globuloin      NOT A RH IMMUNE GLOBULIN CANDIDATE, PT RH POSITIVE Performed at [facility], [address] [place], [place] [address]   Wet prep, genital     Status: Abnormal   Collection Time: 04/26/22 11:23 AM   Specimen: Cervix  Result Value Ref Range   Yeast Wet Prep HPF POC NONE SEEN NONE SEEN   Trich, Wet Prep NONE SEEN NONE SEEN   Clue Cells Wet Prep HPF POC PRESENT (A) NONE SEEN   WBC, Wet Prep HPF POC >=10 (A) <10   Sperm NONE SEEN     Imaging US OB LESS THAN 14 WEEKS WITH OB TRANSVAGINAL  Result Date: 04/26/2022 CLINICAL DATA:  Bleeding. EXAM: OBSTETRIC <14 WK [place] AND TRANSVAGINAL OB US TECHNIQUE: Both transabdominal and transvaginal ultrasound examinations were performed for complete evaluation of the gestation as well as the maternal uterus, adnexal regions, and pelvic cul-de-sac. Transvaginal technique was performed to assess early pregnancy. COMPARISON:  None Available. FINDINGS: Intrauterine gestational sac: Single Yolk sac:  Visualized. Embryo:  Visualized. Cardiac Activity: Visualized. Heart Rate: 101 bpm CRL:  3.7 mm   6 w   0 d                  [place] EDC: 12/20/2022 Subchorionic hemorrhage: Possible small volume of subchorionic hemorrhage. Maternal uterus/adnexae: Corpus luteum noted in the right ovary. Otherwise, unremarkable appearance of the ovaries. Trace free fluid. IMPRESSION: Single intrauterine gestation, as detailed above. Electronically Signed   By: Feliberto Harts M.D.   On: 04/26/2022 12:03    MAU Course  Procedures Lab Orders          Wet  prep, genital         Urinalysis, Routine w reflex microscopic Urine, Clean Catch         CBC         hCG, quantitative, pregnancy         Pregnancy, urine POC    No orders of the defined types were placed in this encounter.  Imaging Orders         US OB LESS THAN 14 WEEKS WITH OB TRANSVAGINAL      MDM +UPT UA, wet prep, GC/chlamydia, CBC, ABO/Rh, quant hCG, and [place] today to rule out ectopic pregnancy which can be life threatening.   Wet prep + for clue cells. No abnormal discharge. Does not meet amsel criteria for BV. GC/CT pending.   Ultrasound shows live IUP measuring [redacted]w[redacted]d (EDD updated) with small subchorionic bleeding.  Minimal bleeding noted on exam & cervix closed.  RH positive.  Assessment and Plan   1. Subchorionic hematoma in first trimester, single or unspecified fetus   2. [redacted] weeks gestation of pregnancy    -Reviewed bleeding & SAB precautions -pelvic rest -GC/CT pending  Judeth Horn, NP 04/26/22 12:21 PM

## 2022-04-26 NOTE — MAU Note (Signed)
Cindy Hunter is a 31 y.o. at Unknown here in MAU reporting: +HPT on Fri. Last night she woke up to use the restroom, noted blood in her PJ pants, saw blood in the toilets.  When cleaning up , saw small blood clots. Didn't know what was going up, never had this before .  Had nausea, vomiting and diarrhea at the time of the bleeding.  Scant blood on tampon this morning, none since. Nausea now. ?LMP: 3/28 ?Onset of complaint:0100 ?Pain score: mild cramping ?Vitals:  ? 04/26/22 1049  ?BP: 119/68  ?Pulse: 91  ?Resp: 16  ?Temp: 98.2 ?F (36.8 ?C)  ?   ? ?Lab orders placed from triage:  urine preg/UA ?

## 2022-04-26 NOTE — Discharge Instructions (Signed)
Return to care  If you have heavier bleeding that soaks through more than 2 pads per hour for an hour or more If you bleed so much that you feel like you might pass out or you do pass out If you have significant abdominal pain that is not improved with Tylenol   

## 2022-04-27 LAB — GC/CHLAMYDIA PROBE AMP (~~LOC~~) NOT AT ARMC
Chlamydia: NEGATIVE
Comment: NEGATIVE
Comment: NORMAL
Neisseria Gonorrhea: NEGATIVE
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
## 2022-09-20 ENCOUNTER — Emergency Department (HOSPITAL_BASED_OUTPATIENT_CLINIC_OR_DEPARTMENT_OTHER): Payer: Medicaid Other

## 2022-09-20 ENCOUNTER — Emergency Department (HOSPITAL_BASED_OUTPATIENT_CLINIC_OR_DEPARTMENT_OTHER)
Admission: EM | Admit: 2022-09-20 | Discharge: 2022-09-21 | Disposition: A | Discharge status: Home / Self Care | Payer: No Typology Code available for payment source | Attending: Emergency Medicine | Admitting: Emergency Medicine

## 2022-09-20 ENCOUNTER — Other Ambulatory Visit: Payer: Self-pay

## 2022-09-20 ENCOUNTER — Encounter (HOSPITAL_BASED_OUTPATIENT_CLINIC_OR_DEPARTMENT_OTHER): Payer: Self-pay | Admitting: Emergency Medicine

## 2022-09-20 DIAGNOSIS — S61212A Laceration without foreign body of right middle finger without damage to nail, initial encounter: Secondary | ICD-10-CM | POA: Diagnosis not present

## 2022-09-20 DIAGNOSIS — S6991XA Unspecified injury of right wrist, hand and finger(s), initial encounter: Secondary | ICD-10-CM | POA: Diagnosis present

## 2022-09-20 DIAGNOSIS — W268XXA Contact with other sharp object(s), not elsewhere classified, initial encounter: Secondary | ICD-10-CM | POA: Diagnosis not present

## 2022-09-20 MED ORDER — BUPIVACAINE HCL (PF) 0.5 % IJ SOLN
10.0000 mL | Freq: Once | INTRAMUSCULAR | Status: AC
  Administered 2022-09-20: 10 mL
  Filled 2022-09-20: qty 10

## 2022-09-20 MED ORDER — IBUPROFEN 400 MG PO TABS
600.0000 mg | ORAL_TABLET | Freq: Once | ORAL | Status: AC
  Administered 2022-09-20: 600 mg via ORAL
  Filled 2022-09-20: qty 1

## 2022-09-20 NOTE — ED Provider Notes (Incomplete)
[facility]PARTMENT Provider Note   CSN: [id number] Arrival date & time: 09/20/22  2154     History {Add pertinent medical, surgical, social history, OB history to HPI:1} Chief Complaint  Patient presents with  . Laceration    R Middle Finger    Cindy Hunter is a 31 y.o. female.   Laceration   31 year old female presents emergency department with complaints of laceration to right middle finger.  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  Bleeding stopped with direct pressure.  Patient denies weakness or sensory deficits in affected hand.  Patient reports last tetanus within the last 10 years.  No significant past medical history  Home Medications Prior to Admission medications   Not on File      Allergies    Penicillins, Phenergan [promethazine hcl], and Promethazine    Review of Systems   Review of Systems  All other systems reviewed and are negative.   Physical Exam Updated Vital Signs BP 122/74 (BP Location: Left Arm)   Pulse 87   Temp 99.7 F (37.6 C) (Oral)   Resp 18   Ht 5\' 2"  (1.575 m)   Wt 84.4 kg   LMP 03/07/2022   SpO2 98%   Breastfeeding Unknown   BMI 34.02 kg/m  Physical Exam Vitals and nursing note reviewed.  Constitutional:      General: She is not in acute distress.    Appearance: She is well-developed.  HENT:     Head: Normocephalic and atraumatic.  Eyes:     Conjunctiva/sclera: Conjunctivae normal.  Cardiovascular:     Rate and Rhythm: Normal rate and regular rhythm.     Heart sounds: No murmur heard. Pulmonary:     Effort: Pulmonary effort is normal. No respiratory distress.     Breath sounds: Normal breath sounds.  Abdominal:     Palpations: Abdomen is soft.     Tenderness: There is no abdominal tenderness.  Musculoskeletal:        General: No swelling.     Cervical back: Neck supple.  Skin:    General: Skin is  warm and dry.     Capillary Refill: Capillary refill takes less than 2 seconds.     Comments: Laceration noted on patient's right third digits on the medial aspect parallel to nail.  No obvious nailbed involvement.  No obvious foreign body retention.  No active bleeding noted.  Patient is full active range of motion of affected digit.  She is complaining of sensory deficits distally.  Neurological:     Mental Status: She is alert.  Psychiatric:        Mood and Affect: Mood normal.     ED Results / Procedures / Treatments   Labs (all labs ordered are listed, but only abnormal results are displayed) Labs Reviewed - No data to display  EKG None  Radiology DG Finger Middle Right  Result Date: 09/20/2022 CLINICAL DATA:  Right middle finger laceration. EXAM: RIGHT MIDDLE FINGER 2+V COMPARISON:  None Available. FINDINGS: There is no evidence of fracture or dislocation. There is no evidence of arthropathy or other focal bone abnormality. Soft tissue swelling about the distal phalanx of the third digit. IMPRESSION: 1. No acute fracture or dislocation. 2. Soft tissue swelling about the distal phalanx of the third digit. Electronically Signed   By: Keane Police D.O.   On: 09/20/2022 22:48  Procedures .Marland KitchenLaceration Repair  Date/Time: 09/20/2022 11:57 PM  Performed by: Wilnette Kales, PA Authorized by: Wilnette Kales, PA   Consent:    Consent obtained:  Verbal   Consent given by:  Patient   Risks, benefits, and alternatives were discussed: yes     Risks discussed:  Infection, need for additional repair, nerve damage, poor wound healing, poor cosmetic result, pain, retained foreign body, tendon damage and vascular damage   Alternatives discussed:  No treatment, delayed treatment, observation and referral Universal protocol:    Procedure explained and questions answered to patient or proxy's satisfaction: yes     Patient identity confirmed:  Verbally with patient Anesthesia:     Anesthesia method:  Local infiltration   Local anesthetic:  Bupivacaine 0.25% w/o epi and bupivacaine 0.5% w/o epi Laceration details:    Location:  Finger   Finger location:  R long finger   Length (cm):  2.6   Depth (mm):  10 Pre-procedure details:    Preparation:  Imaging obtained to evaluate for foreign bodies and patient was prepped and draped in usual sterile fashion Exploration:    Limited defect created (wound extended): no     Hemostasis achieved with:  Direct pressure   Imaging obtained: x-ray     Imaging outcome: foreign body not noted     Wound exploration: wound explored through full range of motion and entire depth of wound visualized     Contaminated: no   Treatment:    Area cleansed with:  Povidone-iodine and saline   Amount of cleaning:  Extensive   Irrigation solution:  Sterile water   Irrigation volume:  250cc   Irrigation method:  Syringe   Visualized foreign bodies/material removed: no     Debridement:  None   Undermining:  None   Scar revision: no   Skin repair:    Repair method:  Sutures   Suture size:  3-0   Suture material:  Nylon Approximation:    Approximation:  Close Repair type:    Repair type:  Simple Post-procedure details:    Dressing:  Non-adherent dressing and splint for protection   Procedure completion:  Tolerated well, no immediate complications   {Document cardiac monitor, telemetry assessment procedure when appropriate:1}  Medications Ordered in ED Medications  bupivacaine(PF) (MARCAINE) 0.5 % injection 10 mL (has no administration in time range)    ED Course/ Medical Decision Making/ A&P                           Medical Decision Making Amount and/or Complexity of Data Reviewed Radiology: ordered.  Risk Prescription drug management.   This patient presents to the ED for concern of laceration, this involves an extensive number of treatment options, and is a complaint that carries with it a high risk of complications and  morbidity.  The differential diagnosis includes laceration, ligamentous/tendinous injury, foreign body retainment, fracture, dislocation   Co morbidities that complicate the patient evaluation  See HPI   Additional history obtained:  Additional history obtained from EMR External records from outside source obtained and reviewed including hospital records   Lab Tests:  N/a   Imaging Studies ordered:  I ordered imaging studies including x-ray right middle finger I independently visualized and interpreted imaging which showed no acute fracture or dislocation.  Soft tissue swelling about the distal phalanx of the third digit. I agree with the radiologist interpretation  Cardiac Monitoring: / EKG:  The patient  was maintained on a cardiac monitor.  I personally viewed and interpreted the cardiac monitored which showed an underlying rhythm of: Sinus rhythm   Consultations Obtained:  N/a   Problem List / ED Course / Critical interventions / Medication management  Laceration I ordered medication including 5% Marcaine for local infiltrative anesthetic, ibuprofen for pain Reevaluation of the patient after these medicines showed that the patient improved I have reviewed the patients home medicines and have made adjustments as needed   Social Determinants of Health:  Denies tobacco, illicit drug use   Test / Admission - Considered:  Laceration Vitals signs significant for ***. Otherwise within normal range and stable throughout visit. Laboratory/imaging studies significant for: *** *** Worrisome signs and symptoms were discussed with the patient, and the patient acknowledged understanding to return to the ED if noticed. Patient was stable upon discharge.    {Document critical care time when appropriate:1} {Document review of labs and clinical decision tools ie heart score, Chads2Vasc2 etc:1}  {Document your independent review of radiology images, and any outside  records:1} {Document your discussion with family members, caretakers, and with consultants:1} {Document social determinants of health affecting pt's care:1} {Document your decision making why or why not admission, treatments were needed:1} Final Clinical Impression(s) / ED Diagnoses Final diagnoses:  None    Rx / DC Orders ED Discharge Orders     None

## 2022-09-20 NOTE — ED Provider Notes (Signed)
[facility] Provider Note   CSN: [id number] Arrival date & time: 09/20/22  2154     History  Chief Complaint  Patient presents with   Laceration    R Middle Finger    Cindy Hunter is a 31 y.o. female.   Laceration   31 year old female presents emergency department with complaints of laceration to right middle finger.  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  Bleeding stopped with direct pressure.  Patient denies weakness or sensory deficits in affected hand.  Patient reports last tetanus within the last 10 years.  No significant past medical history  Home Medications Prior to Admission medications   Not on File      Allergies    Penicillins, Phenergan [promethazine hcl], and Promethazine    Review of Systems   Review of Systems  All other systems reviewed and are negative.   Physical Exam Updated Vital Signs BP 122/74 (BP Location: Left Arm)   Pulse 87   Temp 99.7 F (37.6 C) (Oral)   Resp 18   Ht 5\' 2"  (1.575 m)   Wt 84.4 kg   LMP 03/07/2022   SpO2 98%   Breastfeeding Unknown   BMI 34.02 kg/m  Physical Exam Vitals and nursing note reviewed.  Constitutional:      General: She is not in acute distress.    Appearance: She is well-developed.  HENT:     Head: Normocephalic and atraumatic.  Eyes:     Conjunctiva/sclera: Conjunctivae normal.  Cardiovascular:     Rate and Rhythm: Normal rate and regular rhythm.     Heart sounds: No murmur heard. Pulmonary:     Effort: Pulmonary effort is normal. No respiratory distress.     Breath sounds: Normal breath sounds.  Abdominal:     Palpations: Abdomen is soft.     Tenderness: There is no abdominal tenderness.  Musculoskeletal:        General: No swelling.     Cervical back: Neck supple.  Skin:    General: Skin is warm and dry.     Capillary Refill: Capillary refill takes less than 2  seconds.     Comments: Laceration noted on patient's right third digits on the medial aspect parallel to nail.  No obvious nailbed involvement.  No obvious foreign body retention.  No active bleeding noted.  Patient is full active range of motion of affected digit.  She is complaining of sensory deficits distally.  Neurological:     Mental Status: She is alert.  Psychiatric:        Mood and Affect: Mood normal.     ED Results / Procedures / Treatments   Labs (all labs ordered are listed, but only abnormal results are displayed) Labs Reviewed - No data to display  EKG None  Radiology DG Finger Middle Right  Result Date: 09/20/2022 CLINICAL DATA:  Right middle finger laceration. EXAM: RIGHT MIDDLE FINGER 2+V COMPARISON:  None Available. FINDINGS: There is no evidence of fracture or dislocation. There is no evidence of arthropathy or other focal bone abnormality. Soft tissue swelling about the distal phalanx of the third digit. IMPRESSION: 1. No acute fracture or dislocation. 2. Soft tissue swelling about the distal phalanx of the third digit. Electronically Signed   By: Keane Police D.O.   On: 09/20/2022 22:48    Procedures .Marland KitchenLaceration Repair  Date/Time: 09/20/2022 11:57  PM  Performed by: Wilnette Kales, PA Authorized by: Wilnette Kales, PA   Consent:    Consent obtained:  Verbal   Consent given by:  Patient   Risks, benefits, and alternatives were discussed: yes     Risks discussed:  Infection, need for additional repair, nerve damage, poor wound healing, poor cosmetic result, pain, retained foreign body, tendon damage and vascular damage   Alternatives discussed:  No treatment, delayed treatment, observation and referral Universal protocol:    Procedure explained and questions answered to patient or proxy's satisfaction: yes     Patient identity confirmed:  Verbally with patient Anesthesia:    Anesthesia method:  Local infiltration   Local anesthetic:  Bupivacaine  0.25% w/o epi and bupivacaine 0.5% w/o epi Laceration details:    Location:  Finger   Finger location:  R long finger   Length (cm):  2.6   Depth (mm):  10 Pre-procedure details:    Preparation:  Imaging obtained to evaluate for foreign bodies and patient was prepped and draped in usual sterile fashion Exploration:    Limited defect created (wound extended): no     Hemostasis achieved with:  Direct pressure   Imaging obtained: x-ray     Imaging outcome: foreign body not noted     Wound exploration: wound explored through full range of motion and entire depth of wound visualized     Contaminated: no   Treatment:    Area cleansed with:  Povidone-iodine and saline   Amount of cleaning:  Extensive   Irrigation solution:  Sterile water   Irrigation volume:  250cc   Irrigation method:  Syringe   Visualized foreign bodies/material removed: no     Debridement:  None   Undermining:  None   Scar revision: no   Skin repair:    Repair method:  Tissue adhesive Approximation:    Approximation:  Close Repair type:    Repair type:  Simple Post-procedure details:    Dressing:  Non-adherent dressing and splint for protection   Procedure completion:  Tolerated well, no immediate complications Comments:     Patient's finger was numbed via digital block as indicated above.  After finger was entirely numb, patient elected for tissue adhesive versus sutures although against medical guidance.  She stated she was fearful of the needle although she had already had digital block performed.  Wound was closed using tissue adhesive and wrapped in bulky dressing with finger splint applied afterward.     Medications Ordered in ED Medications  bupivacaine(PF) (MARCAINE) 0.5 % injection 10 mL (10 mLs Infiltration Given 09/20/22 2317)  ibuprofen (ADVIL) tablet 600 mg (600 mg Oral Given 09/20/22 2317)    ED Course/ Medical Decision Making/ A&P                           Medical Decision Making Amount  and/or Complexity of Data Reviewed Radiology: ordered.  Risk Prescription drug management.   This patient presents to the ED for concern of laceration, this involves an extensive number of treatment options, and is a complaint that carries with it a high risk of complications and morbidity.  The differential diagnosis includes laceration, ligamentous/tendinous injury, foreign body retainment, fracture, dislocation   Co morbidities that complicate the patient evaluation  See HPI   Additional history obtained:  Additional history obtained from EMR External records from outside source obtained and reviewed including hospital records   Lab Tests:  N/a  Imaging Studies ordered:  I ordered imaging studies including x-ray right middle finger I independently visualized and interpreted imaging which showed no acute fracture or dislocation.  Soft tissue swelling about the distal phalanx of the third digit. I agree with the radiologist interpretation  Cardiac Monitoring: / EKG:  The patient was maintained on a cardiac monitor.  I personally viewed and interpreted the cardiac monitored which showed an underlying rhythm of: Sinus rhythm   Consultations Obtained:  N/a   Problem List / ED Course / Critical interventions / Medication management  Laceration I ordered medication including 5% Marcaine for local infiltrative anesthetic, ibuprofen for pain Reevaluation of the patient after these medicines showed that the patient improved I have reviewed the patients home medicines and have made adjustments as needed   Social Determinants of Health:  Denies tobacco, illicit drug use   Test / Admission - Considered:  Laceration Vitals signs within normal range and stable throughout visit. Imaging studies significant for: See above Patient's wound was closed in manner as indicated above.  Patient recommended close follow-up with PCP in 5 to 7 days for reevaluation of affected  finger.  Patient was educated regarding the risk of dehiscence because she elected for tissue adhesive over suture placement after finger was entirely numb.  She still elected for tissue adhesive due to fear of needle.  The finger was dressed in bulky dressing with finger splint applied for added protection.  Patient recommended Tylenol/ibuprofen as needed for pain.  Treatment plan discussed with the patient she can understand was agreeable to said plan. Worrisome signs and symptoms were discussed with the patient, and the patient acknowledged understanding to return to the ED if noticed. Patient was stable upon discharge.          Final Clinical Impression(s) / ED Diagnoses Final diagnoses:  Laceration of right middle finger without foreign body without damage to nail, initial encounter    Rx / DC Orders ED Discharge Orders     None         Wilnette Kales, [place] 09/21/22 [id number]    Dorie Rank, MD 09/21/22 [phone]

## 2022-09-20 NOTE — ED Triage Notes (Signed)
Pt presents with right middle finger laceration from piece of metal on a oven at work. Occurred tonight. Bleeding controlled.

## 2022-09-21 NOTE — ED Notes (Signed)
Dermabond placed by provider after med admin to RT middle finger. Splint will be placed per provider order for protection.

## 2022-09-21 NOTE — Discharge Instructions (Addendum)
Note the work-up today was overall consistent with laceration.  Your wound was repaired using sutures.  Tissue adhesive will absorb with time send no need for repeat visit for removal.  Wash area with warm soapy water and use splint for protection.  Agree signs of infection as we discussed.  Please do not hesitate to return to the emergency department for worrisome signs and symptoms we discussed become apparent.

## 2022-11-05 ENCOUNTER — Inpatient Hospital Stay (HOSPITAL_COMMUNITY): Payer: Medicaid Other

## 2022-11-05 ENCOUNTER — Inpatient Hospital Stay (HOSPITAL_COMMUNITY)
Admission: AD | Admit: 2022-11-05 | Discharge: 2022-11-05 | Disposition: A | Discharge status: Home / Self Care | Payer: Medicaid Other | Attending: Obstetrics and Gynecology | Admitting: Obstetrics and Gynecology

## 2022-11-05 ENCOUNTER — Encounter (HOSPITAL_COMMUNITY): Payer: Self-pay | Admitting: Obstetrics and Gynecology

## 2022-11-05 DIAGNOSIS — Z3A01 Less than 8 weeks gestation of pregnancy: Secondary | ICD-10-CM | POA: Diagnosis present

## 2022-11-05 DIAGNOSIS — O99891 Other specified diseases and conditions complicating pregnancy: Secondary | ICD-10-CM | POA: Insufficient documentation

## 2022-11-05 DIAGNOSIS — O219 Vomiting of pregnancy, unspecified: Secondary | ICD-10-CM

## 2022-11-05 DIAGNOSIS — M549 Dorsalgia, unspecified: Secondary | ICD-10-CM | POA: Insufficient documentation

## 2022-11-05 DIAGNOSIS — Z3491 Encounter for supervision of normal pregnancy, unspecified, first trimester: Secondary | ICD-10-CM

## 2022-11-05 DIAGNOSIS — R109 Unspecified abdominal pain: Secondary | ICD-10-CM | POA: Diagnosis not present

## 2022-11-05 LAB — URINALYSIS, ROUTINE W REFLEX MICROSCOPIC
Bacteria, UA: NONE SEEN
Bilirubin Urine: NEGATIVE
Glucose, UA: NEGATIVE mg/dL
Hgb urine dipstick: NEGATIVE
Ketones, ur: 80 mg/dL — AB
Leukocytes,Ua: NEGATIVE
Nitrite: NEGATIVE
Protein, ur: 30 mg/dL — AB
Specific Gravity, Urine: 1.03 (ref 1.005–1.030)
pH: 5 (ref 5.0–8.0)

## 2022-11-05 LAB — CBC
HCT: 41.1 % (ref 36.0–46.0)
Hemoglobin: 14 g/dL (ref 12.0–15.0)
MCH: 29.2 pg (ref 26.0–34.0)
MCHC: 34.1 g/dL (ref 30.0–36.0)
MCV: 85.8 fL (ref 80.0–100.0)
Platelets: 296 10*3/uL (ref 150–400)
RBC: 4.79 MIL/uL (ref 3.87–5.11)
RDW: 14 % (ref 11.5–15.5)
WBC: 11.3 10*3/uL — ABNORMAL HIGH (ref 4.0–10.5)
nRBC: 0 % (ref 0.0–0.2)

## 2022-11-05 LAB — WET PREP, GENITAL
Sperm: NONE SEEN
Trich, Wet Prep: NONE SEEN
WBC, Wet Prep HPF POC: <10 (ref <10)
Yeast Wet Prep HPF POC: NONE SEEN

## 2022-11-05 LAB — POCT PREGNANCY, URINE: Preg Test, Ur: POSITIVE — AB

## 2022-11-05 LAB — HCG, QUANTITATIVE, PREGNANCY: hCG, Beta Chain, Quant, S: 60231 m[IU]/mL — ABNORMAL HIGH (ref <5)

## 2022-11-05 MED ORDER — ONDANSETRON 4 MG PO TBDP: 4.0000 mg | ORAL_TABLET | Freq: Four times a day (QID) | ORAL | 2 refills | Status: DC | PRN

## 2022-11-05 MED ORDER — PROMETHAZINE HCL 12.5 MG PO TABS: 12.5000 mg | ORAL_TABLET | Freq: Four times a day (QID) | ORAL | 2 refills | Status: DC | PRN

## 2022-11-05 MED ORDER — FAMOTIDINE IN NACL 20-0.9 MG/50ML-% IV SOLN
20.0000 mg | Freq: Once | INTRAVENOUS | Status: AC
  Administered 2022-11-05: 20 mg via INTRAVENOUS
  Filled 2022-11-05: qty 50

## 2022-11-05 MED ORDER — TRANSDERM-SCOP 1 MG/3DAYS TD PT72: 1.0000 | MEDICATED_PATCH | TRANSDERMAL | 1 refills | Status: DC

## 2022-11-05 MED ORDER — LACTATED RINGERS IV BOLUS
1000.0000 mL | Freq: Once | INTRAVENOUS | Status: AC
  Administered 2022-11-05: 1000 mL via INTRAVENOUS

## 2022-11-05 MED ORDER — PROMETHAZINE HCL 25 MG/ML IJ SOLN
25.0000 mg | Freq: Once | INTRAVENOUS | Status: AC
  Administered 2022-11-05: 25 mg via INTRAVENOUS
  Filled 2022-11-05: qty 1

## 2022-11-05 MED ORDER — ONDANSETRON HCL 4 MG/2ML IJ SOLN
4.0000 mg | Freq: Once | INTRAMUSCULAR | Status: AC
  Administered 2022-11-05: 4 mg via INTRAVENOUS
  Filled 2022-11-05: qty 2

## 2022-11-05 MED ORDER — SCOPOLAMINE 1 MG/3DAYS TD PT72
1.0000 | MEDICATED_PATCH | TRANSDERMAL | Status: DC
  Administered 2022-11-05: 1.5 mg via TRANSDERMAL
  Filled 2022-11-05: qty 1

## 2022-11-05 NOTE — MAU Note (Addendum)
.  Cindy Hunter is a 31 y.o. at Unknown here in MAU reporting: had a +HPT last week. States she started having n/v since she found out and is now unable to keep anything down and has blood streaked emesis. Unable to count how many times she's thrown up in the last 24 hours. No Vb or abnormal discharge. Patient has started having sharp pain on her left side and in her back for x3 days. Has not had any medication for pain.  LMP: 09/22/22 Pain score: 10 Vitals:   11/05/22 1225  BP: 119/65  Pulse: 83  Resp: 17  Temp: 98.2 F (36.8 C)  SpO2: 100%      Lab orders placed from triage:  UPT

## 2022-11-05 NOTE — MAU Provider Note (Signed)
Chief Complaint: Abdominal Pain, Back Pain, Nausea, and Emesis   Event Date/Time   First Provider Initiated Contact with Patient 11/05/22 1329      SUBJECTIVE HPI: Cindy Hunter is a 31 y.o. [id number] at [redacted]w[redacted]d by LMP who presents to maternity admissions reporting onset of n/v approximately 2 weeks ago when she had a positive home pregnancy test.  She reports n/v worsening, so she is unable to keep anything down and sees blood streaked emesis. She is unable to go to work due to the vomiting.      HPI  Past Medical History:  Diagnosis Date   Medical history non-contributory    Past Surgical History:  Procedure Laterality Date   MULTIPLE TOOTH EXTRACTIONS     THERAPEUTIC ABORTION     WISDOM TOOTH EXTRACTION     Social History   Socioeconomic History   Marital status: Single    Spouse name: Not on file   Number of children: Not on file   Years of education: Not on file   Highest education level: Not on file  Occupational History   Not on file  Tobacco Use   Smoking status: Never   Smokeless tobacco: Never  Vaping Use   Vaping Use: Never used  Substance and Sexual Activity   Alcohol use: Not Currently   Drug use: No   Sexual activity: Yes    Birth control/protection: Injection    Comment: Depo shots until Jan 2020 due to Covid-19  Other Topics Concern   Not on file  Social History Narrative   Not on file   Social Determinants of Health   Financial Resource Strain: Not on file  Food Insecurity: Not on file  Transportation Needs: Not on file  Physical Activity: Not on file  Stress: Not on file  Social Connections: Not on file  Intimate Partner Violence: Not on file   No current facility-administered medications on file prior to encounter.   No current outpatient medications on file prior to encounter.   Allergies  Allergen Reactions   Penicillins Hives and Rash   Phenergan [Promethazine Hcl] Nausea And Vomiting    Pt tolerated IV Phenergan without complication  on 11/05/22   Promethazine Nausea And Vomiting    Unknown     ROS:  Review of Systems  Constitutional:  Negative for chills, fatigue and fever.  Respiratory:  Negative for shortness of breath.   Cardiovascular:  Negative for chest pain.  Gastrointestinal:  Positive for abdominal pain, nausea and vomiting.  Genitourinary:  Negative for difficulty urinating, dysuria, flank pain, pelvic pain, vaginal bleeding, vaginal discharge and vaginal pain.  Neurological:  Negative for dizziness and headaches.  Psychiatric/Behavioral: Negative.       I have reviewed patient's Past Medical Hx, Surgical Hx, Family Hx, Social Hx, medications and allergies.   Physical Exam  Patient Vitals for the past 24 hrs:  BP Temp Temp src Pulse Resp SpO2 Weight  11/05/22 1645 -- 98.2 F (36.8 C) Axillary -- -- -- --  11/05/22 1535 (!) 107/52 -- -- 79 17 -- --  11/05/22 1306 (!) 101/53 -- -- 80 17 -- --  11/05/22 1225 119/65 98.2 F (36.8 C) Oral 83 17 100 % 80.8 kg   Constitutional: Well-developed, well-nourished female in no acute distress.  Cardiovascular: normal rate Respiratory: normal effort GI: Abd soft, non-tender. Pos BS x 4 MS: Extremities nontender, no edema, normal ROM Neurologic: Alert and oriented x 4.  GU: Neg CVAT.  PELVIC EXAM: self swab collected  by pt   LAB RESULTS Results for orders placed or performed during the hospital encounter of 11/05/22 (from the past 24 hour(s))  Pregnancy, urine POC     Status: Abnormal   Collection Time: 11/05/22 12:57 PM  Result Value Ref Range   Preg Test, Ur POSITIVE (A) NEGATIVE  CBC     Status: Abnormal   Collection Time: 11/05/22  1:42 PM  Result Value Ref Range   WBC 11.3 (H) 4.0 - 10.5 K/uL   RBC 4.79 3.87 - 5.11 MIL/uL   Hemoglobin 14.0 12.0 - 15.0 g/dL   HCT 58.5 92.9 - 24.4 %   MCV 85.8 80.0 - 100.0 fL   MCH 29.2 26.0 - 34.0 pg   MCHC 34.1 30.0 - 36.0 g/dL   RDW 62.8 63.8 - 17.7 %   Platelets 296 150 - 400 K/uL   nRBC 0.0 0.0 - 0.2  %  hCG, quantitative, pregnancy     Status: Abnormal   Collection Time: 11/05/22  1:42 PM  Result Value Ref Range   hCG, Beta Chain, Quant, S 60,231 (H) <5 mIU/mL  Wet prep, genital     Status: Abnormal   Collection Time: 11/05/22  1:42 PM   Specimen: Vaginal  Result Value Ref Range   Yeast Wet Prep HPF POC NONE SEEN NONE SEEN   Trich, Wet Prep NONE SEEN NONE SEEN   Clue Cells Wet Prep HPF POC PRESENT (A) NONE SEEN   WBC, Wet Prep HPF POC <10 <10   Sperm NONE SEEN   Urinalysis, Routine w reflex microscopic Urine, Clean Catch     Status: Abnormal   Collection Time: 11/05/22  3:51 PM  Result Value Ref Range   Color, Urine YELLOW YELLOW   APPearance HAZY (A) CLEAR   Specific Gravity, Urine 1.030 1.005 - 1.030   pH 5.0 5.0 - 8.0   Glucose, UA NEGATIVE NEGATIVE mg/dL   Hgb urine dipstick NEGATIVE NEGATIVE   Bilirubin Urine NEGATIVE NEGATIVE   Ketones, ur 80 (A) NEGATIVE mg/dL   Protein, ur 30 (A) NEGATIVE mg/dL   Nitrite NEGATIVE NEGATIVE   Leukocytes,Ua NEGATIVE NEGATIVE   RBC / HPF 0-5 0 - 5 RBC/hpf   WBC, UA 0-5 0 - 5 WBC/hpf   Bacteria, UA NONE SEEN NONE SEEN   Squamous Epithelial / LPF 11-20 0 - 5   Mucus PRESENT     --/--/O POS (05/17 1121)  IMAGING No results found. CLINICAL DATA: [id number] Abdominal pain during pregnancy in first trimester [id number]  EXAM: OBSTETRIC <14 WK [place] AND TRANSVAGINAL OB US  TECHNIQUE: Transvaginal ultrasound was performed for complete evaluation of the gestation as well as the maternal uterus, adnexal regions, and pelvic cul-de-sac.  COMPARISON: 04/26/2022  FINDINGS: Intrauterine gestational sac: Single  Yolk sac: Visualized.  Embryo: Visualized.  Cardiac Activity: Visualized.  Heart Rate: 117 bpm  CRL: 0.3 cm = 5 weeks 6 days  [place] EDC: 06/29/2023  Subchorionic hemorrhage: None visualized.  Adnexa: No masses or fluid collections.  IMPRESSION: Single viable intrauterine pregnancy measuring 5 weeks 6 days with ultrasound  [facility] 06/29/2023. No adnexal pathology.   Electronically Signed By: Layla Maw M.D. On: 11/05/2022 14:58   MAU Management/MDM: Orders Placed This Encounter  Procedures   Wet prep, genital   US OB LESS THAN 14 WEEKS WITH OB TRANSVAGINAL   CBC   hCG, quantitative, pregnancy   Urinalysis, Routine w reflex microscopic Urine, Clean Catch   Pregnancy, urine POC   Discharge patient  Meds ordered this encounter  Medications   lactated ringers bolus 1,000 mL   promethazine (PHENERGAN) 25 mg in lactated ringers 1,000 mL infusion   famotidine (PEPCID) IVPB 20 mg premix   ondansetron (ZOFRAN) injection 4 mg   lactated ringers bolus 1,000 mL   scopolamine (TRANSDERM-SCOP) 1 MG/3DAYS 1.5 mg   scopolamine (TRANSDERM-SCOP) 1 MG/3DAYS    Sig: Place 1 patch (1.5 mg total) onto the skin every 3 (three) days.    Dispense:  10 patch    Refill:  1    Order Specific Question:   Supervising Provider    Answer:   Mariel Aloe A [[id number]]   promethazine (PHENERGAN) 12.5 MG tablet    Sig: Take 1-2 tablets (12.5-25 mg total) by mouth every 6 (six) hours as needed for nausea or vomiting. Place vaginally if you cannot tolerate pills by mouth.    Dispense:  30 tablet    Refill:  2    Order Specific Question:   Supervising Provider    Answer:   Mariel Aloe A [[id number]]   ondansetron (ZOFRAN-ODT) 4 MG disintegrating tablet    Sig: Take 1 tablet (4 mg total) by mouth every 6 (six) hours as needed for nausea.    Dispense:  20 tablet    Refill:  2    Order Specific Question:   Supervising Provider    Answer:   Mariel Aloe A [[id number]]    IUP noted on [place] today.  Nausea improved but not enough to tolerate PO fluids after Phenergan and Pepcid, so Zofran 4 mg IV given. Pt tolerated PO fluids and food in MAU.  Scopolamine patch ordered and placed in MAU. Rx for Phenergan Q 6 hours (PO or vaginally if cannot tolerate PO), Zofran 4 mg Q 8 hours, and scopolamine patch Q 72 hours.  Pt plans pregnancy  termination and has appt scheduled.  Return to MAU as needed for emergencies.    ASSESSMENT 1. Normal intrauterine pregnancy on prenatal ultrasound in first trimester   2. Nausea and vomiting during pregnancy prior to [redacted] weeks gestation   3. [redacted] weeks gestation of pregnancy     PLAN Discharge home Allergies as of 11/05/2022       Reactions   Penicillins Hives, Rash   Phenergan [promethazine Hcl] Nausea And Vomiting   Pt tolerated IV Phenergan without complication on 11/05/22   Promethazine Nausea And Vomiting   Unknown        Medication List     TAKE these medications    ondansetron 4 MG disintegrating tablet Commonly known as: ZOFRAN-ODT Take 1 tablet (4 mg total) by mouth every 6 (six) hours as needed for nausea.   promethazine 12.5 MG tablet Commonly known as: PHENERGAN Take 1-2 tablets (12.5-25 mg total) by mouth every 6 (six) hours as needed for nausea or vomiting. Place vaginally if you cannot tolerate pills by mouth.   Transderm-Scop 1 MG/3DAYS Generic drug: scopolamine Place 1 patch (1.5 mg total) onto the skin every 3 (three) days.         Sharen Counter Certified Nurse-Midwife 11/05/2022  5:25 PM

## 2022-11-06 LAB — GC/CHLAMYDIA PROBE AMP (~~LOC~~) NOT AT ARMC
Chlamydia: NEGATIVE
Comment: NEGATIVE
Comment: NORMAL
Neisseria Gonorrhea: NEGATIVE

## 2023-06-29 ENCOUNTER — Emergency Department (HOSPITAL_BASED_OUTPATIENT_CLINIC_OR_DEPARTMENT_OTHER)
Admission: EM | Admit: 2023-06-29 | Discharge: 2023-06-29 | Disposition: A | Discharge status: Home / Self Care | Payer: Medicaid Other | Attending: Emergency Medicine | Admitting: Emergency Medicine

## 2023-06-29 ENCOUNTER — Emergency Department (HOSPITAL_BASED_OUTPATIENT_CLINIC_OR_DEPARTMENT_OTHER): Payer: Medicaid Other

## 2023-06-29 ENCOUNTER — Encounter (HOSPITAL_BASED_OUTPATIENT_CLINIC_OR_DEPARTMENT_OTHER): Payer: Self-pay

## 2023-06-29 ENCOUNTER — Other Ambulatory Visit: Payer: Self-pay

## 2023-06-29 DIAGNOSIS — O021 Missed abortion: Secondary | ICD-10-CM

## 2023-06-29 DIAGNOSIS — O209 Hemorrhage in early pregnancy, unspecified: Secondary | ICD-10-CM | POA: Diagnosis present

## 2023-06-29 LAB — URINALYSIS, ROUTINE W REFLEX MICROSCOPIC

## 2023-06-29 LAB — URINALYSIS, MICROSCOPIC (REFLEX): RBC / HPF: >50 RBC/hpf (ref 0–5)

## 2023-06-29 LAB — CBC
HCT: 40.2 % (ref 36.0–46.0)
Hemoglobin: 13.1 g/dL (ref 12.0–15.0)
MCH: 28.5 pg (ref 26.0–34.0)
MCHC: 32.6 g/dL (ref 30.0–36.0)
MCV: 87.6 fL (ref 80.0–100.0)
Platelets: 260 10*3/uL (ref 150–400)
RBC: 4.59 MIL/uL (ref 3.87–5.11)
RDW: 14 % (ref 11.5–15.5)
WBC: 6.5 10*3/uL (ref 4.0–10.5)
nRBC: 0 % (ref 0.0–0.2)

## 2023-06-29 LAB — COMPREHENSIVE METABOLIC PANEL WITH GFR
ALT: 15 U/L (ref 0–44)
AST: 16 U/L (ref 15–41)
Albumin: 3.9 g/dL (ref 3.5–5.0)
Alkaline Phosphatase: 39 U/L (ref 38–126)
Anion gap: 8 (ref 5–15)
BUN: 9 mg/dL (ref 6–20)
CO2: 24 mmol/L (ref 22–32)
Calcium: 8.5 mg/dL — ABNORMAL LOW (ref 8.9–10.3)
Chloride: 107 mmol/L (ref 98–111)
Creatinine, Ser: 0.79 mg/dL (ref 0.44–1.00)
GFR, Estimated: >60 mL/min
Glucose, Bld: 87 mg/dL (ref 70–99)
Potassium: 4.1 mmol/L (ref 3.5–5.1)
Sodium: 139 mmol/L (ref 135–145)
Total Bilirubin: 0.6 mg/dL (ref 0.3–1.2)
Total Protein: 7.4 g/dL (ref 6.5–8.1)

## 2023-06-29 LAB — HCG, QUANTITATIVE, PREGNANCY: hCG, Beta Chain, Quant, S: 27 m[IU]/mL — ABNORMAL HIGH

## 2023-06-29 MED ORDER — ACETAMINOPHEN 500 MG PO TABS
1000.0000 mg | ORAL_TABLET | Freq: Once | ORAL | Status: AC
  Administered 2023-06-29: 1000 mg via ORAL

## 2023-06-29 NOTE — Discharge Instructions (Signed)
Thank you for coming to [facility] Emergency Department. You were seen for vaginal bleeding in early pregnancy. We did an exam, labs, and imaging, and these showed a completed miscarriage. Please follow up with your OB/Gyn within 1-2 weeks for recheck of your lab values and reevaluation.   Do not hesitate to return to the ED or call 911 if you experience: -Worsening symptoms -Saturating >2 pads per hour for >2 hours -Chest pain, shortness of breath -Severe abdominal pain -Lightheadedness, passing out -Fevers/chills -Anything else that concerns you

## 2023-06-29 NOTE — ED Triage Notes (Signed)
Patient stated she is [redacted] weeks pregnant. She stated she started spotting last night and it has continued to get worse today. She is having cramping and lower back pain.

## 2023-06-29 NOTE — ED Provider Notes (Signed)
[facility] Provider Note   CSN: [id number] Arrival date & time: 06/29/23  4098     History  Chief Complaint  Patient presents with   Vaginal Bleeding    Cindy Hunter is a 32 y.o. female [phone] with PMH as listed below who presents stating she is [redacted] weeks pregnant. She stated she started spotting last night and it has continued to get worse today. Now heavier than a period and she states she soaked a pad this morning when she woke up. She is having cramping and lower back pain which is mild. She states this is her 6th pregnancy - has two living children both born term by SVD, 2 elective abortions, and one prior miscarriage. No h/o abd surgery. No f/c, CP, SOB, vaginal itching/discharge, urinary sxs, diarrhea/constipation. She had ultrasound w/ OB a couple of days ago and was told it was very early pregnancy, couldn't find heartbeat d/t being too early, but did see an IUP. Unsure if this is a desired pregnancy.   Past Medical History:  Diagnosis Date   Medical history non-contributory        Home Medications Prior to Admission medications   Medication Sig Start Date End Date Taking? Authorizing Provider  ondansetron (ZOFRAN-ODT) 4 MG disintegrating tablet Take 1 tablet (4 mg total) by mouth every 6 (six) hours as needed for nausea. 11/05/22   Leftwich-Kirby, Wilmer Floor, CNM  promethazine (PHENERGAN) 12.5 MG tablet Take 1-2 tablets (12.5-25 mg total) by mouth every 6 (six) hours as needed for nausea or vomiting. Place vaginally if you cannot tolerate pills by mouth. 11/05/22   Leftwich-Kirby, Wilmer Floor, CNM  scopolamine (TRANSDERM-SCOP) 1 MG/3DAYS Place 1 patch (1.5 mg total) onto the skin every 3 (three) days. 11/05/22   Leftwich-Kirby, Wilmer Floor, CNM      Allergies    Penicillins, Phenergan [promethazine hcl], and Promethazine    Review of Systems   Review of Systems A 10 point review of systems was performed and is negative unless otherwise  reported in HPI.  Physical Exam Updated Vital Signs BP 106/65   Pulse 72   Temp 98.4 F (36.9 C)   Resp 15   Ht 5\' 2"  (1.575 m)   Wt 80.8 kg   LMP 05/24/2023 (Exact Date)   SpO2 100%   BMI 32.58 kg/m  Physical Exam General: Normal appearing female, lying in bed.  HEENT: Sclera anicteric, MMM, trachea midline.  Cardiology: RRR, no murmurs/rubs/gallops.  Resp: Normal respiratory rate and effort. CTAB, no wheezes, rhonchi, crackles.  Abd: Soft, non-tender, non-distended. No rebound tenderness or guarding.  Pelvic: Performed w/ RN Chaperone/assistant. Normal appearing external genitalia. No lacerations/wounds to vaginal vault. Mild amount of blood in vault. Closed cervix. Minimal blood coming from cervical os.  MSK: No peripheral edema or signs of trauma.  Skin: warm, dry. Back: No CVA tenderness Neuro: A&Ox4, CNs II-XII grossly intact. MAEs. Sensation grossly intact.  Psych: Normal mood and affect.   ED Results / Procedures / Treatments   Labs (all labs ordered are listed, but only abnormal results are displayed) Labs Reviewed  HCG, QUANTITATIVE, PREGNANCY - Abnormal; Notable for the following components:      Result Value   hCG, Beta Chain, Quant, S 27 (*)    All other components within normal limits  COMPREHENSIVE METABOLIC PANEL - Abnormal; Notable for the following components:   Calcium 8.5 (*)    All other components within normal limits  URINALYSIS, ROUTINE W REFLEX  MICROSCOPIC - Abnormal; Notable for the following components:   Color, Urine RED (*)    APPearance TURBID (*)    Glucose, UA   (*)    Value: TEST NOT REPORTED DUE TO COLOR INTERFERENCE OF URINE PIGMENT   Hgb urine dipstick   (*)    Value: TEST NOT REPORTED DUE TO COLOR INTERFERENCE OF URINE PIGMENT   Bilirubin Urine   (*)    Value: TEST NOT REPORTED DUE TO COLOR INTERFERENCE OF URINE PIGMENT   Ketones, ur   (*)    Value: TEST NOT REPORTED DUE TO COLOR INTERFERENCE OF URINE PIGMENT   Protein, ur   (*)     Value: TEST NOT REPORTED DUE TO COLOR INTERFERENCE OF URINE PIGMENT   Nitrite   (*)    Value: TEST NOT REPORTED DUE TO COLOR INTERFERENCE OF URINE PIGMENT   Leukocytes,Ua   (*)    Value: TEST NOT REPORTED DUE TO COLOR INTERFERENCE OF URINE PIGMENT   All other components within normal limits  URINALYSIS, MICROSCOPIC (REFLEX) - Abnormal; Notable for the following components:   Bacteria, UA MANY (*)    All other components within normal limits  CBC    EKG None  Radiology US OB LESS THAN 14 WEEKS WITH OB TRANSVAGINAL  Result Date: 06/29/2023 CLINICAL DATA:  Heavy vaginal bleeding with cramping for 1 day. EXAM: OBSTETRIC <14 WK [place] AND TRANSVAGINAL OB US TECHNIQUE: Both transabdominal and transvaginal ultrasound examinations were performed for complete evaluation of the gestation as well as the maternal uterus, adnexal regions, and pelvic cul-de-sac. Transvaginal technique was performed to assess early pregnancy. COMPARISON:  None Available. FINDINGS: Intrauterine gestational sac: None Yolk sac:  Not Visualized. Embryo:  Not Visualized. Cardiac Activity: Not Visualized. Heart Rate: NA bpm Maternal uterus/adnexae: Subchorionic hemorrhage: NA Right ovary: Normal Left ovary: Normal Other :Nabothian cyst measures 6 mm. Free fluid:  Trace free fluid noted. IMPRESSION: 1. No intrauterine gestational sac, yolk sac, or fetal pole identified. Differential considerations include intrauterine pregnancy too early to be sonographically visualized, missed abortion, or ectopic pregnancy. Followup ultrasound is recommended in 10-14 days for further evaluation. Electronically Signed   By: Signa Kell M.D.   On: 06/29/2023 12:21    Procedures Procedures    Medications Ordered in ED Medications  acetaminophen (TYLENOL) tablet 1,000 mg (1,000 mg Oral Given 06/29/23 0944)    ED Course/ Medical Decision Making/ A&P                          Medical Decision Making Amount and/or Complexity of Data  Reviewed Labs: ordered. Decision-making details documented in ED Course. Radiology: ordered. Decision-making details documented in ED Course.  Risk OTC drugs.    This patient presents to the ED for concern of pregnant VB, this involves an extensive number of treatment options, and is a complaint that carries with it a high risk of complications and morbidity.  I considered the following differential and admission for this acute, potentially life threatening condition.   MDM:    For vaginal bleeding in pregnancy <20 wks, consider:  Complete vs incomplete vs inevitable vs missed vs threatened abortion. No f/c or leukocytosis to suggest a Septic abortions. [place] doesn't show Gestational trophoblastic disease, hcg 46.  Considered Rhogam, however patient has O positive blood.   She has baseline hemoglobin, HDS, no dizziness, no c/f ABLA or vaginal hemorrhage. Mild bleeding on pelvic exam w/ closed cervix.   With no reported  passage of fetal parts, abd pain/vag bleeding, closed cervix, no IUP on [place], patient with missed abortion. Instructed to f/u with her OB within 1-2 weeks for recheck of beta hCG and reevaluation. Given DC instructions/return precautions.    Clinical Course as of 06/29/23 1337  Fri Jun 29, 2023  0927 WBC: 6.5 No leukocytosis  [HN]  0927 Hemoglobin: 13.1 Baseline Hgb [HN]  0938 Urinalysis, Routine w reflex microscopic -Urine, Clean Catch(!) Interference d/t vag bleeding [HN]  1041 HCG, Beta Chain, Quant, S(!): 27 [HN]  1235 US OB LESS THAN 14 WEEKS WITH OB TRANSVAGINAL 1. No intrauterine gestational sac, yolk sac, or fetal pole identified. Differential considerations include intrauterine pregnancy too early to be sonographically visualized, missed abortion, or ectopic pregnancy. Followup ultrasound is recommended in 10-14 days for further evaluation.   [HN]    Clinical Course User Index [HN] Loetta Rough, MD    Labs: I Ordered, and personally interpreted  labs.  The pertinent results include:  those listed above  Imaging Studies ordered: I ordered imaging studies including OB US I independently visualized and interpreted imaging. I agree with the radiologist interpretation  Additional history obtained from chart review.  External records from outside source obtained and reviewed including [facility]  Reevaluation: After the interventions noted above, I reevaluated the patient and found that they have :stayed the same  Social Determinants of Health: Lives independently  Disposition:  DC w/ discharge instructions/return precautions. All questions answered to patient's satisfaction.    Co morbidities that complicate the patient evaluation  Past Medical History:  Diagnosis Date   Medical history non-contributory      Medicines Meds ordered this encounter  Medications   acetaminophen (TYLENOL) tablet 1,000 mg    I have reviewed the patients home medicines and have made adjustments as needed  Problem List / ED Course: Problem List Items Addressed This Visit   None Visit Diagnoses     Missed abortion    -  Primary                   This note was created using dictation software, which may contain spelling or grammatical errors.    Loetta Rough, MD 06/29/23 [phone]

## 2023-07-29 IMAGING — US US OB < 14 WEEKS - US OB TV
1 series · 15 of 28 positions shown · non-contrast
Comparison: None Available.

CLINICAL DATA: Bleeding.

EXAM:
OBSTETRIC <14 WK US AND TRANSVAGINAL OB US
TECHNIQUE: Both transabdominal and transvaginal ultrasound examinations were
performed for complete evaluation of the gestation as well as the
maternal uterus, adnexal regions, and pelvic cul-de-sac.
Transvaginal technique was performed to assess early pregnancy.

[Series 1: us ob < 14 weeks - us ob tv · 15 of 70 slices shown]
[im 1/70]
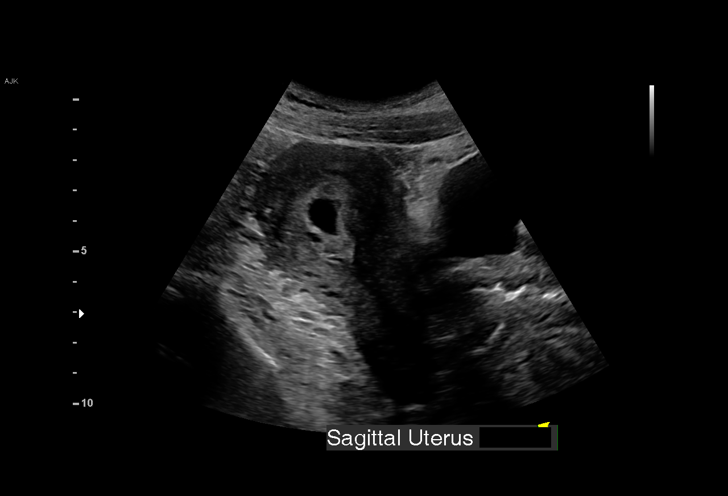
[im 6/70]
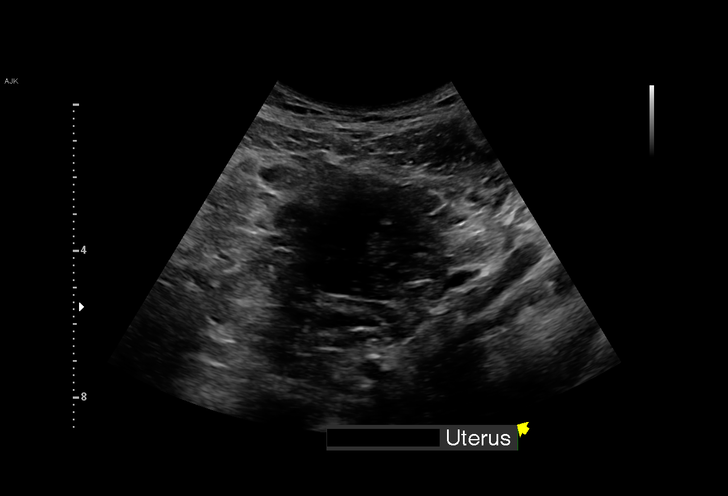
[im 11/70]
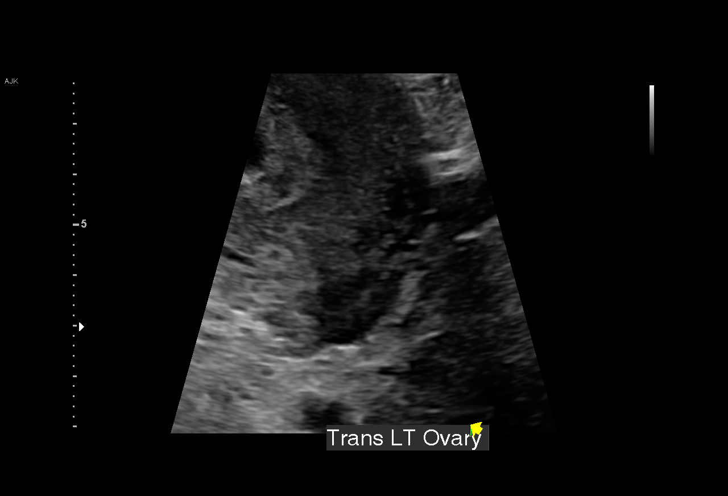
[im 16/70]
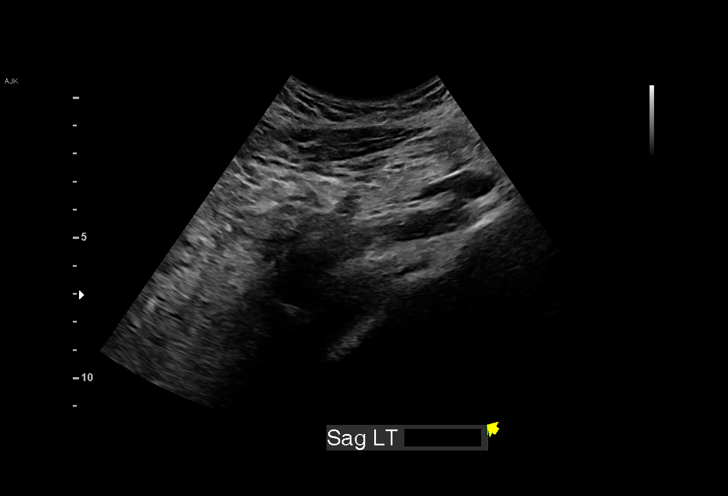
[im 21/70]
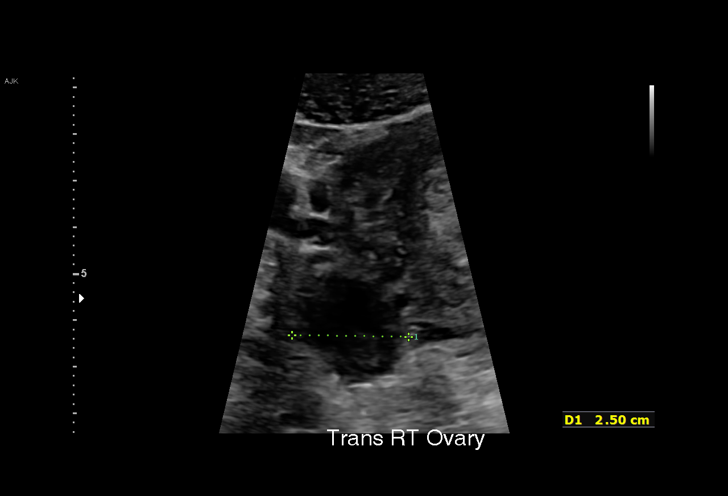
[im 26/70]
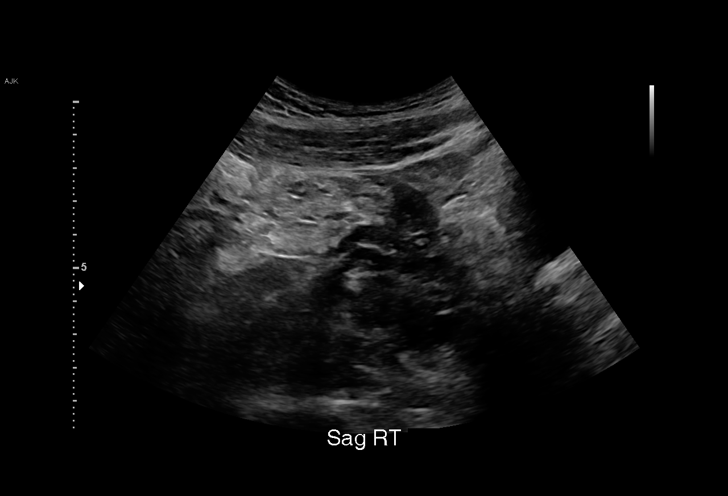
[im 31/70]
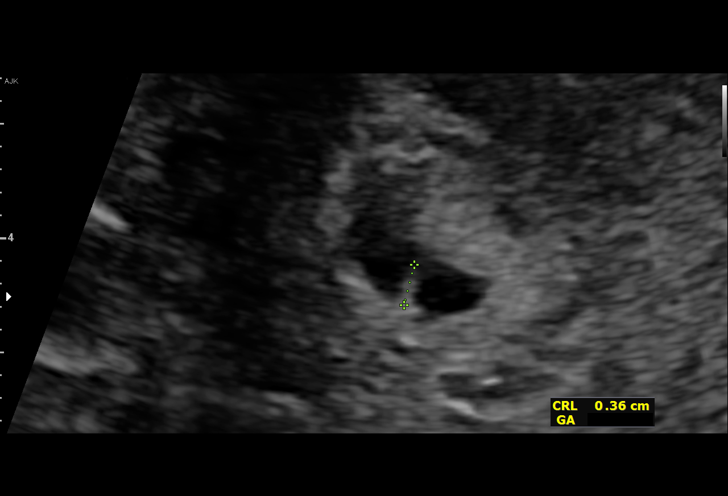
[im 36/70]
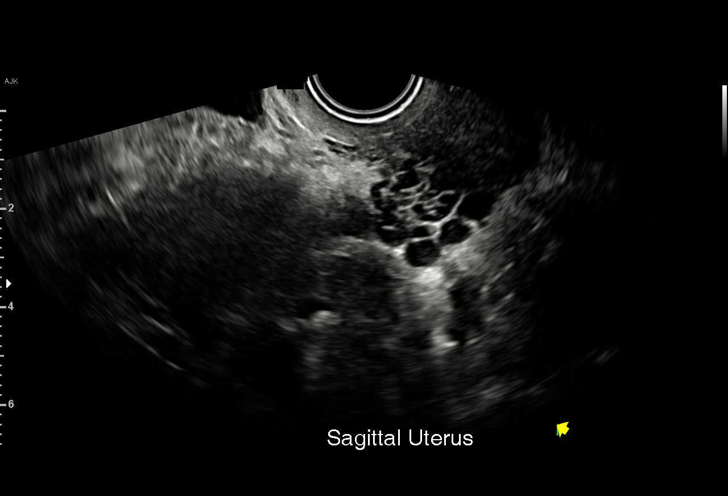
[im 39/70]
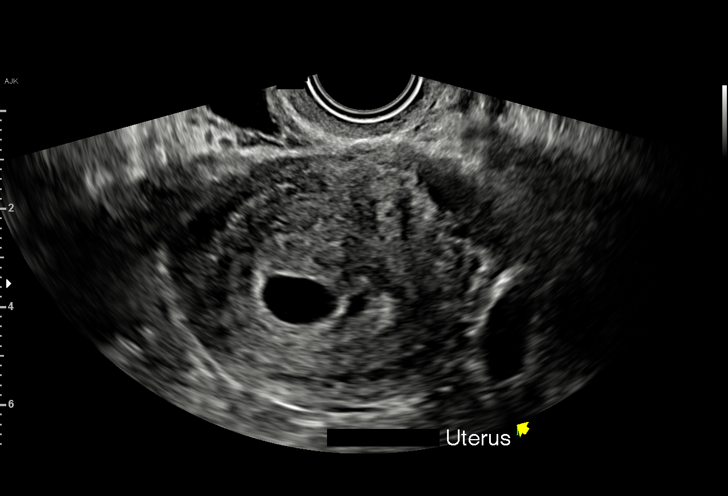
[im 44/70]
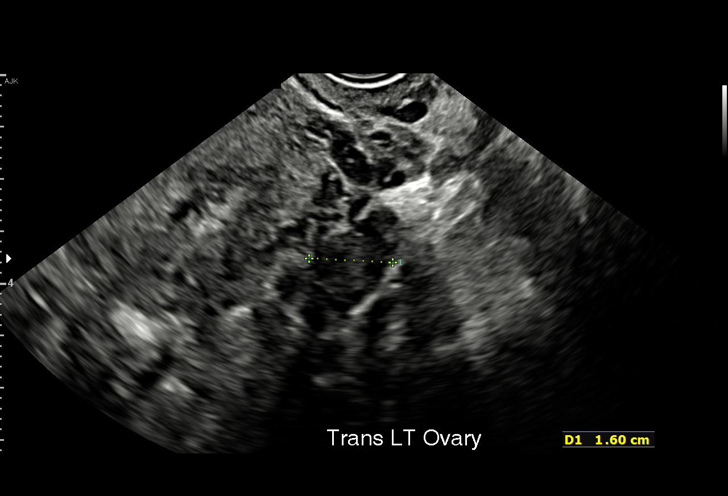
[im 49/70]
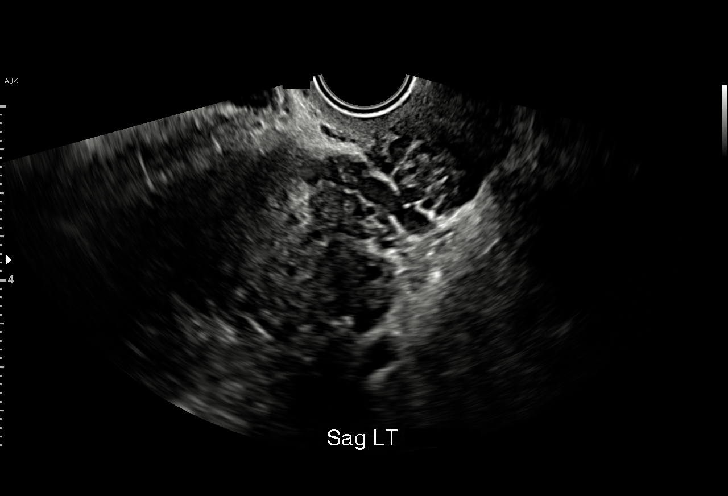
[im 54/70]
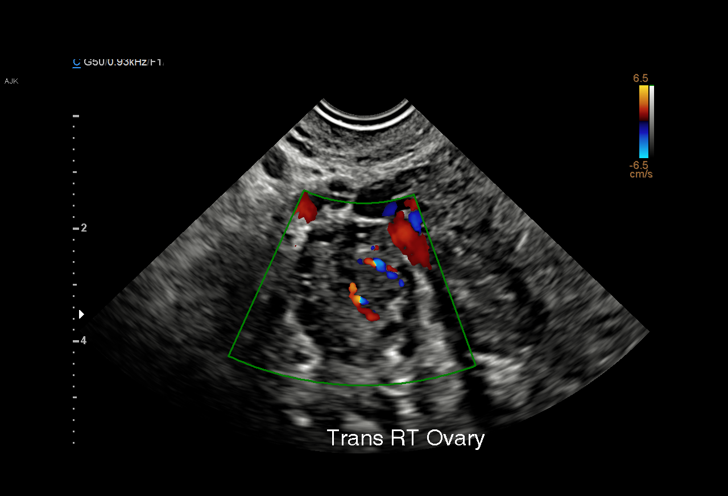
[im 59/70]
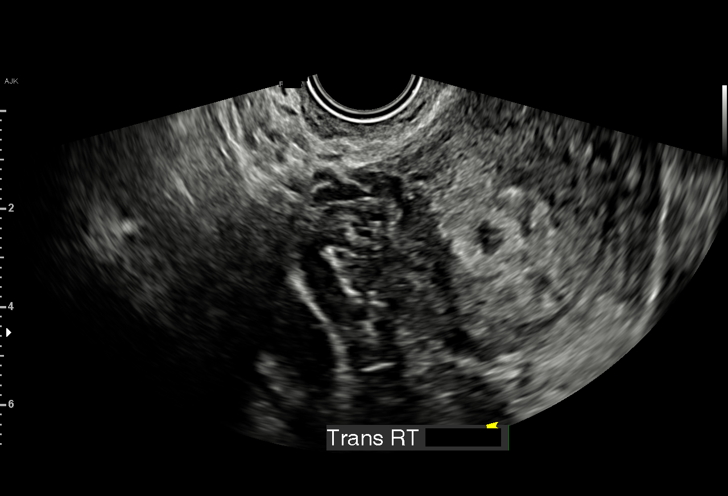
[im 64/70]
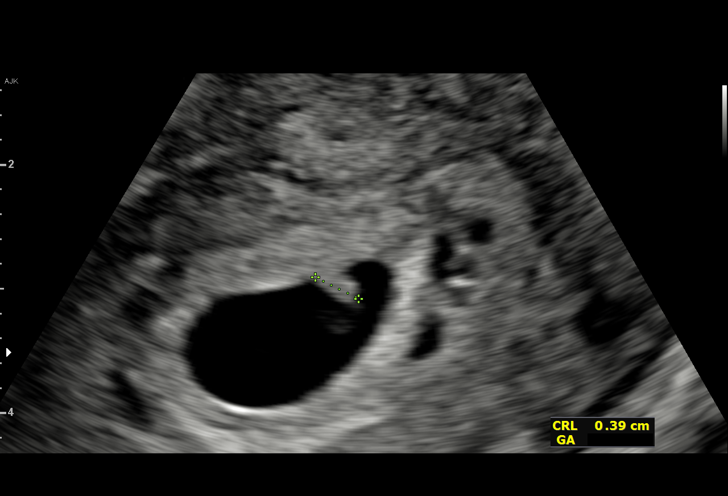
[im 70/70]
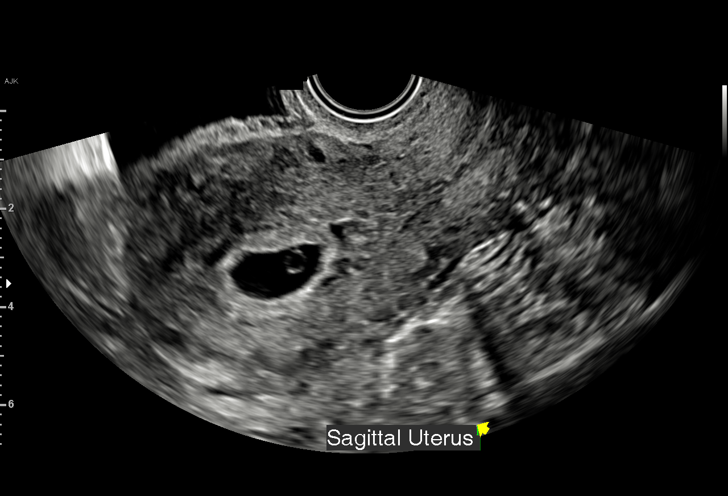

[15 of 28 positions shown; findings below may reference images not displayed]

FINDINGS: Intrauterine gestational sac: Single

Yolk sac:  Visualized.

Embryo:  Visualized.

Cardiac Activity: Visualized.

Heart Rate: 101 bpm

CRL:  3.7 mm   6 w   0 d                  US EDC: 12/20/2022

Subchorionic hemorrhage: Possible small volume of subchorionic
hemorrhage.

Maternal uterus/adnexae: Corpus luteum noted in the right ovary.
Otherwise, unremarkable appearance of the ovaries. Trace free fluid.
IMPRESSION: Single intrauterine gestation, as detailed above.

## 2023-12-02 ENCOUNTER — Other Ambulatory Visit: Payer: Self-pay

## 2023-12-02 ENCOUNTER — Encounter (HOSPITAL_BASED_OUTPATIENT_CLINIC_OR_DEPARTMENT_OTHER): Payer: Self-pay

## 2023-12-02 ENCOUNTER — Emergency Department (HOSPITAL_BASED_OUTPATIENT_CLINIC_OR_DEPARTMENT_OTHER)
Admission: EM | Admit: 2023-12-02 | Discharge: 2023-12-02 | Disposition: A | Discharge status: Home / Self Care | Payer: Medicaid Other | Attending: Emergency Medicine | Admitting: Emergency Medicine

## 2023-12-02 DIAGNOSIS — O219 Vomiting of pregnancy, unspecified: Secondary | ICD-10-CM | POA: Insufficient documentation

## 2023-12-02 DIAGNOSIS — E876 Hypokalemia: Secondary | ICD-10-CM | POA: Diagnosis not present

## 2023-12-02 DIAGNOSIS — Z3A00 Weeks of gestation of pregnancy not specified: Secondary | ICD-10-CM | POA: Diagnosis not present

## 2023-12-02 DIAGNOSIS — E871 Hypo-osmolality and hyponatremia: Secondary | ICD-10-CM | POA: Diagnosis not present

## 2023-12-02 DIAGNOSIS — R112 Nausea with vomiting, unspecified: Secondary | ICD-10-CM

## 2023-12-02 DIAGNOSIS — O210 Mild hyperemesis gravidarum: Secondary | ICD-10-CM

## 2023-12-02 LAB — COMPREHENSIVE METABOLIC PANEL
ALT: 23 U/L (ref 0–44)
AST: 21 U/L (ref 15–41)
Albumin: 4.4 g/dL (ref 3.5–5.0)
Alkaline Phosphatase: 40 U/L (ref 38–126)
Anion gap: 10 (ref 5–15)
BUN: 13 mg/dL (ref 6–20)
CO2: 25 mmol/L (ref 22–32)
Calcium: 9.3 mg/dL (ref 8.9–10.3)
Chloride: 98 mmol/L (ref 98–111)
Creatinine, Ser: 0.77 mg/dL (ref 0.44–1.00)
GFR, Estimated: >60 mL/min (ref >60)
Glucose, Bld: 78 mg/dL (ref 70–99)
Potassium: 3.1 mmol/L — ABNORMAL LOW (ref 3.5–5.1)
Sodium: 133 mmol/L — ABNORMAL LOW (ref 135–145)
Total Bilirubin: 1.3 mg/dL — ABNORMAL HIGH (ref <1.2)
Total Protein: 8.1 g/dL (ref 6.5–8.1)

## 2023-12-02 LAB — CBC
HCT: 41.4 % (ref 36.0–46.0)
Hemoglobin: 14.2 g/dL (ref 12.0–15.0)
MCH: 29.3 pg (ref 26.0–34.0)
MCHC: 34.3 g/dL (ref 30.0–36.0)
MCV: 85.4 fL (ref 80.0–100.0)
Platelets: 264 10*3/uL (ref 150–400)
RBC: 4.85 MIL/uL (ref 3.87–5.11)
RDW: 13.2 % (ref 11.5–15.5)
WBC: 10 10*3/uL (ref 4.0–10.5)
nRBC: 0 % (ref 0.0–0.2)

## 2023-12-02 LAB — URINALYSIS, ROUTINE W REFLEX MICROSCOPIC
Glucose, UA: NEGATIVE mg/dL
Hgb urine dipstick: NEGATIVE
Ketones, ur: >=80 mg/dL — AB
Leukocytes,Ua: NEGATIVE
Nitrite: NEGATIVE
Protein, ur: 30 mg/dL — AB
Specific Gravity, Urine: >=1.03 (ref 1.005–1.030)
pH: 6.5 (ref 5.0–8.0)

## 2023-12-02 LAB — HCG, QUANTITATIVE, PREGNANCY: hCG, Beta Chain, Quant, S: 55968 m[IU]/mL — ABNORMAL HIGH (ref <5)

## 2023-12-02 LAB — URINALYSIS, MICROSCOPIC (REFLEX)

## 2023-12-02 LAB — MAGNESIUM: Magnesium: 2 mg/dL (ref 1.7–2.4)

## 2023-12-02 LAB — LIPASE, BLOOD: Lipase: 33 U/L (ref 11–51)

## 2023-12-02 MED ORDER — METOCLOPRAMIDE HCL 5 MG/ML IJ SOLN
10.0000 mg | Freq: Once | INTRAMUSCULAR | Status: AC
  Administered 2023-12-02: 10 mg via INTRAVENOUS
  Filled 2023-12-02: qty 2

## 2023-12-02 MED ORDER — ONDANSETRON 4 MG PO TBDP: 4.0000 mg | ORAL_TABLET | Freq: Three times a day (TID) | ORAL | 0 refills | Status: DC | PRN

## 2023-12-02 MED ORDER — DIPHENHYDRAMINE HCL 50 MG/ML IJ SOLN
25.0000 mg | Freq: Once | INTRAMUSCULAR | Status: AC
  Administered 2023-12-02: 25 mg via INTRAVENOUS
  Filled 2023-12-02: qty 1

## 2023-12-02 MED ORDER — POTASSIUM CHLORIDE CRYS ER 20 MEQ PO TBCR: 20.0000 meq | EXTENDED_RELEASE_TABLET | Freq: Once | ORAL | 0 refills | Status: DC

## 2023-12-02 MED ORDER — LACTATED RINGERS IV BOLUS
1000.0000 mL | Freq: Once | INTRAVENOUS | Status: AC
  Administered 2023-12-02: 1000 mL via INTRAVENOUS

## 2023-12-02 MED ORDER — DOXYLAMINE-PYRIDOXINE 10-10 MG PO TBEC: 1.0000 | DELAYED_RELEASE_TABLET | ORAL | 0 refills | Status: AC

## 2023-12-02 MED ORDER — POTASSIUM CHLORIDE CRYS ER 20 MEQ PO TBCR
40.0000 meq | EXTENDED_RELEASE_TABLET | Freq: Once | ORAL | Status: AC
  Administered 2023-12-02: 40 meq via ORAL
  Filled 2023-12-02: qty 2

## 2023-12-02 MED ORDER — ONDANSETRON 4 MG PO TBDP
8.0000 mg | ORAL_TABLET | Freq: Once | ORAL | Status: AC
  Administered 2023-12-02: 8 mg via ORAL
  Filled 2023-12-02: qty 2

## 2023-12-02 NOTE — ED Notes (Signed)
Attempted to get fetal heart tones. 142 BPM noted for a few seconds but not able to get a concrete BPM. PA to access.

## 2023-12-02 NOTE — ED Notes (Signed)
Pt tolerated graham crackers well.

## 2023-12-02 NOTE — ED Notes (Signed)
Pt unable to provide urine sample at this time 

## 2023-12-02 NOTE — ED Triage Notes (Signed)
Recently found out she was pregnant, LMP 11/9. C/o hyperemesis. Unable to tolerate PO. C/o weakness. Denies abdominal pain/vaginal bleeding.  G5P2  2 miscarriages

## 2023-12-02 NOTE — Discharge Instructions (Addendum)
As discussed, visit today overall reassuring.  Your potassium level was slightly low so we will replace in the outpatient setting for the next few days.  You technically do have hyperemesis gravidarum, will trial treatment of nausea and vomiting in the outpatient setting.  Recommend continued use of your at home prenatal vitamins.  Will send you home with a few different nausea medicines to use that you are on from prior pregnancy.  Recommend close follow-up with OB/GYN in the outpatient setting for reassessment.  Please do not hesitate to return if the worrisome signs and symptoms we discussed become apparent.

## 2023-12-02 NOTE — ED Provider Notes (Signed)
[facility] Provider Note   CSN: [id number] Arrival date & time: 12/02/23  4098     History  Chief Complaint  Patient presents with   Emesis During Pregnancy    Cindy Hunter is a 32 y.o. female.  HPI   32 year old female presents emergency department with complaints of nausea, vomiting.  Patient reports symptoms present for last week or so.  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  Reports history of similar symptoms in the past from prior pregnancies lasting the whole 9 months.  Has not seen OB/GYN for recurrent pregnancy.  Denies any abdominal pain, fever, chills, urinary symptoms, change in bowel habits.  Reports vomiting throughout the day for the past week with unknown amounts of time.  Denies any marijuana use.  States that she had not tried any medication for her symptoms.  End of last menstrual period 10/20/2023  Past medical history significant for intractable nausea and vomiting, hyperemesis gravidarum  Home Medications Prior to Admission medications   Medication Sig Start Date End Date Taking? Authorizing Provider  Doxylamine-Pyridoxine 10-10 MG TBEC Take 1 tablet by mouth See admin instructions. Initial, 2 tablets  orally at bedtime on day 1 and 2; if symptoms persist, take 1 tablet in morning and 2 tablets at bedtime on day 3; iF symptoms persist, may increase to MAX 4 tablets per day, administered as 1 tablet in the morning, 1 tablet in mid-afternoon and 2 tablets at bedtime 12/02/23  Yes Sherian Maroon A, PA  ondansetron (ZOFRAN-ODT) 4 MG disintegrating tablet Take 1 tablet (4 mg total) by mouth every 8 (eight) hours as needed. 12/02/23  Yes Sherian Maroon A, PA  potassium chloride SA (KLOR-CON M) 20 MEQ tablet Take 1 tablet (20 mEq total) by mouth once for 1 dose. 12/03/23 12/03/23 Yes Peter Garter, PA      Allergies    Penicillins, Phenergan [promethazine hcl], and Promethazine    Review of Systems   Review of  Systems  All other systems reviewed and are negative.   Physical Exam Updated Vital Signs BP 116/71 (BP Location: Right Arm)   Pulse 93   Temp 97.6 F (36.4 C) (Axillary)   Resp 18   Wt 68.5 kg   LMP 10/20/2023   SpO2 100%   BMI 27.62 kg/m  Physical Exam Vitals and nursing note reviewed.  Constitutional:      General: She is not in acute distress.    Appearance: She is well-developed.  HENT:     Head: Normocephalic and atraumatic.  Eyes:     Conjunctiva/sclera: Conjunctivae normal.  Cardiovascular:     Rate and Rhythm: Normal rate and regular rhythm.     Heart sounds: No murmur heard. Pulmonary:     Effort: Pulmonary effort is normal. No respiratory distress.     Breath sounds: Normal breath sounds. No wheezing, rhonchi or rales.  Abdominal:     Palpations: Abdomen is soft.     Tenderness: There is no abdominal tenderness. There is no guarding.  Musculoskeletal:        General: No swelling.     Cervical back: Neck supple.  Skin:    General: Skin is warm and dry.     Capillary Refill: Capillary refill takes less than 2 seconds.  Neurological:     Mental Status: She is alert.  Psychiatric:        Mood and Affect: Mood normal.     ED Results / Procedures /  Treatments   Labs (all labs ordered are listed, but only abnormal results are displayed) Labs Reviewed  COMPREHENSIVE METABOLIC PANEL - Abnormal; Notable for the following components:      Result Value   Sodium 133 (*)    Potassium 3.1 (*)    Total Bilirubin 1.3 (*)    All other components within normal limits  HCG, QUANTITATIVE, PREGNANCY - Abnormal; Notable for the following components:   hCG, Beta Chain, Quant, S 01,601 (*)    All other components within normal limits  URINALYSIS, ROUTINE W REFLEX MICROSCOPIC - Abnormal; Notable for the following components:   Bilirubin Urine SMALL (*)    Ketones, ur >=80 (*)    Protein, ur 30 (*)    All other components within normal limits  URINALYSIS, MICROSCOPIC  (REFLEX) - Abnormal; Notable for the following components:   Bacteria, UA FEW (*)    All other components within normal limits  CBC  MAGNESIUM  LIPASE, BLOOD    EKG None  Radiology No results found.  Procedures Procedures    Medications Ordered in ED Medications  lactated ringers bolus 1,000 mL (0 mLs Intravenous Stopped 12/02/23 1111)  metoCLOPramide (REGLAN) injection 10 mg (10 mg Intravenous Given 12/02/23 1001)  diphenhydrAMINE (BENADRYL) injection 25 mg (25 mg Intravenous Given 12/02/23 0959)  potassium chloride SA (KLOR-CON M) CR tablet 40 mEq (40 mEq Oral Given 12/02/23 1049)  ondansetron (ZOFRAN-ODT) disintegrating tablet 8 mg (8 mg Oral Given 12/02/23 1146)    ED Course/ Medical Decision Making/ A&P Clinical Course as of 12/02/23 1232  Sun Dec 02, 2023  1141 Reevaluation the patient did show improvement of feelings of nausea.  Patient has not had episode of vomiting for around 2 hours after initial antiemetic given.  Tolerating liquids.  Will attempt p.o. trial with solids and reassessed afterwards.  Labs concerning for hyperemesis gravidarum.  Patient desiring to manage symptoms at home if able to. [CR]    Clinical Course User Index [CR] Peter Garter, PA                                 Medical Decision Making Amount and/or Complexity of Data Reviewed Labs: ordered.  Risk Prescription drug management.   This patient presents to the ED for concern of nausea, vomiting, this involves an extensive number of treatment options, and is a complaint that carries with it a high risk of complications and morbidity.  The differential diagnosis includes hyperemesis gravidarum, viral gastroenteritis, gastritis, PUD, CBD pathology, cholecystitis, SBO/LBO, volvulus, diverticulitis, appendicitis, pyelonephritis, nephrolithiasis, cystitis, other   Co morbidities that complicate the patient evaluation  See HPI   Additional history obtained:  Additional history  obtained from EMR External records from outside source obtained and reviewed including hospital records   Lab Tests:  I Ordered, and personally interpreted labs.  The pertinent results include: No leukocytosis.  No evidence of anemia.  Platelets within range.  Mild hyponatremia, hypokalemia 133 and 3.1 respectively.  Otherwise, electrolytes within normal limits.  No transaminitis.  Isolated elevation of total bilirubin of 1.3 most likely secondary to dehydration.  Lipase within normal limits.  UA with greater than 80 ketones, contaminated sample with 6-10 squamous epithelial cells, few bacteria but with negative nitrate, leukocyte, WBC.  UA also with budding yeast as well as mucus present.   Imaging Studies ordered:  N/a   Cardiac Monitoring: / EKG:  The patient was maintained on a  cardiac monitor.  I personally viewed and interpreted the cardiac monitored which showed an underlying rhythm of: Sinus rhythm   Consultations Obtained:  N/a   Problem List / ED Course / Critical interventions / Medication management  Nausea, vomiting, hyperemesis gravidarum I ordered medication including lactated Ringer's, Reglan, Benadryl, potassium chloride, Zofran Reevaluation of the patient after these medicines showed that the patient improved I have reviewed the patients home medicines and have made adjustments as needed   Social Determinants of Health:  Denies tobacco, licit drug use   Test / Admission - Considered:  Nausea, vomiting, hyperemesis gravidarum Vitals signs within normal range and stable throughout visit. Laboratory studies significant for: See above 32 year old female presents emergency department complaints of nausea and vomiting in the setting of pregnancy over the past week.  Per patient, end of last menstrual cycle 10/20/2023.  Aligning closely with her hCG today.  No abdominal tenderness or reported vaginal discharge/bleeding.  Labs concerning for hyperemesis gravidarum  with potassium 3.1, greater than 80 ketones in the urine.  Treated with antiemetic as well as IV fluids with subsequent tolerance of p.o. liquids and foods.  Patient observed for 3+ hours in the ED without any repeat episodes of emesis.  Shared decision made conversation was had with patient regarding admission versus discharge home and patient elected for discharge home with continued treatment of nausea/vomiting and follow-up with OB/GYN.  Recommend continue use of prenatal vitamin antiemetic as needed, dietary modifications.  Strict return precautions discussed at length.  Patient overall well-appearing, afebrile in no acute distress.  Treatment plan discussed length with patient and she knowledge understanding was agreeable to said plan.   Worrisome signs and symptoms were discussed with the patient, and the patient acknowledged understanding to return to the ED if noticed. Patient was stable upon discharge.          Final Clinical Impression(s) / ED Diagnoses Final diagnoses:  Nausea and vomiting, unspecified vomiting type  Hyperemesis gravidarum    Rx / DC Orders ED Discharge Orders          Ordered    potassium chloride SA (KLOR-CON M) 20 MEQ tablet   Once        12/02/23 1229    ondansetron (ZOFRAN-ODT) 4 MG disintegrating tablet  Every 8 hours PRN        12/02/23 1223    Doxylamine-Pyridoxine 10-10 MG TBEC  See admin instructions        12/02/23 1223              Peter Garter, [place] 12/02/23 1232    Terald Sleeper, MD 12/02/23 1241

## 2023-12-06 ENCOUNTER — Inpatient Hospital Stay (HOSPITAL_COMMUNITY)
Admission: AD | Admit: 2023-12-06 | Discharge: 2023-12-06 | Disposition: A | Discharge status: Home / Self Care | Payer: Medicaid Other | Attending: Obstetrics and Gynecology | Admitting: Obstetrics and Gynecology

## 2023-12-06 ENCOUNTER — Encounter (HOSPITAL_COMMUNITY): Payer: Self-pay

## 2023-12-06 DIAGNOSIS — O219 Vomiting of pregnancy, unspecified: Secondary | ICD-10-CM | POA: Diagnosis not present

## 2023-12-06 DIAGNOSIS — O99611 Diseases of the digestive system complicating pregnancy, first trimester: Secondary | ICD-10-CM | POA: Diagnosis not present

## 2023-12-06 DIAGNOSIS — Z3A01 Less than 8 weeks gestation of pregnancy: Secondary | ICD-10-CM | POA: Insufficient documentation

## 2023-12-06 DIAGNOSIS — O26891 Other specified pregnancy related conditions, first trimester: Secondary | ICD-10-CM | POA: Insufficient documentation

## 2023-12-06 LAB — URINALYSIS, ROUTINE W REFLEX MICROSCOPIC
Bilirubin Urine: NEGATIVE
Glucose, UA: NEGATIVE mg/dL
Hgb urine dipstick: NEGATIVE
Ketones, ur: 80 mg/dL — AB
Leukocytes,Ua: NEGATIVE
Nitrite: NEGATIVE
Protein, ur: 100 mg/dL — AB
Specific Gravity, Urine: 1.035 — ABNORMAL HIGH (ref 1.005–1.030)
pH: 5 (ref 5.0–8.0)

## 2023-12-06 LAB — COMPREHENSIVE METABOLIC PANEL
ALT: 31 U/L (ref 0–44)
AST: 24 U/L (ref 15–41)
Albumin: 4.2 g/dL (ref 3.5–5.0)
Alkaline Phosphatase: 40 U/L (ref 38–126)
Anion gap: 14 (ref 5–15)
BUN: 11 mg/dL (ref 6–20)
CO2: 23 mmol/L (ref 22–32)
Calcium: 9.7 mg/dL (ref 8.9–10.3)
Chloride: 98 mmol/L (ref 98–111)
Creatinine, Ser: 0.77 mg/dL (ref 0.44–1.00)
GFR, Estimated: >60 mL/min (ref >60)
Glucose, Bld: 82 mg/dL (ref 70–99)
Potassium: 3.6 mmol/L (ref 3.5–5.1)
Sodium: 135 mmol/L (ref 135–145)
Total Bilirubin: 1 mg/dL (ref <1.2)
Total Protein: 7.7 g/dL (ref 6.5–8.1)

## 2023-12-06 MED ORDER — FAMOTIDINE IN NACL 20-0.9 MG/50ML-% IV SOLN: 20.0000 mg | Freq: Once | INTRAVENOUS | Status: DC

## 2023-12-06 MED ORDER — ONDANSETRON HCL 4 MG/2ML IJ SOLN
4.0000 mg | Freq: Once | INTRAMUSCULAR | Status: AC
  Administered 2023-12-06: 4 mg via INTRAVENOUS
  Filled 2023-12-06: qty 2

## 2023-12-06 MED ORDER — FAMOTIDINE 20 MG PO TABS
40.0000 mg | ORAL_TABLET | Freq: Once | ORAL | Status: AC
  Administered 2023-12-06: 40 mg via ORAL
  Filled 2023-12-06: qty 2

## 2023-12-06 MED ORDER — SCOPOLAMINE 1 MG/3DAYS TD PT72
1.0000 | MEDICATED_PATCH | TRANSDERMAL | Status: DC
  Administered 2023-12-06: 1.5 mg via TRANSDERMAL
  Filled 2023-12-06: qty 1

## 2023-12-06 MED ORDER — TRANSDERM-SCOP 1 MG/3DAYS TD PT72: 1.0000 | MEDICATED_PATCH | TRANSDERMAL | 12 refills | Status: AC

## 2023-12-06 MED ORDER — FAMOTIDINE 40 MG PO TABS: 40.0000 mg | ORAL_TABLET | Freq: Every day | ORAL | 3 refills | Status: AC

## 2023-12-06 MED ORDER — LACTATED RINGERS IV BOLUS
1000.0000 mL | Freq: Once | INTRAVENOUS | Status: AC
  Administered 2023-12-06: 1000 mL via INTRAVENOUS

## 2023-12-06 NOTE — MAU Note (Signed)
.  Cindy Hunter is a 32 y.o. at [redacted]w[redacted]d here in MAU reporting: stated she has not been able to eat for a week and a half. . Throwing up all the time. Was given IVF Sunday and sent home with Zofran and digleges. Stated they are not working.   LMP:  Onset of complaint: 1.5 weks Pain score: 0 Vitals:   12/06/23 0937  BP: 122/75  Pulse: 87  Resp: 18  Temp: 98.2 F (36.8 C)     FHT:n/a Lab orders placed from triage: u/a

## 2023-12-06 NOTE — MAU Provider Note (Addendum)
History     CSN: [id number]  Arrival date and time: 12/06/23 0900   None     Chief Complaint  Patient presents with   Emesis   HPI Patient presenting today for evaluation for nausea and vomiting during pregnancy.  She is 6 weeks 5 days gestation.  Reports that she has not been able to keep anything down for about a week and a half.  She was seen 4 days ago in the MAU and given IV fluids and Zofran and her symptoms improved.  She was discharged with p.o. Zofran and Diclegis and reports that she has been taking them but they are not helping much.  Still unable to keep anything down.  Denies any fever or chills.  Denies any headaches, blurry vision, chest pain, shortness of breath.  Reports no diarrhea or sick contacts.  OB History     Gravida  8   Para  2   Term  2   Preterm  0   AB  4   Living  2      SAB  0   IAB  4   Ectopic  0   Multiple  0   Live Births  2           Past Medical History:  Diagnosis Date   Medical history non-contributory     Past Surgical History:  Procedure Laterality Date   MULTIPLE TOOTH EXTRACTIONS     THERAPEUTIC ABORTION     WISDOM TOOTH EXTRACTION      Family History  Problem Relation Age of Onset   Healthy Mother    Hypertension Father    Hypertension Maternal Grandmother    Arthritis Maternal Grandmother    Diabetes Maternal Grandfather    Hypertension Maternal Grandfather     Social History   Tobacco Use   Smoking status: Never   Smokeless tobacco: Never  Vaping Use   Vaping status: Never Used  Substance Use Topics   Alcohol use: Not Currently   Drug use: No    Allergies:  Allergies  Allergen Reactions   Penicillins Hives and Rash   Phenergan [Promethazine Hcl] Nausea And Vomiting    Pt tolerated IV Phenergan without complication on 11/05/22   Promethazine Nausea And Vomiting    Unknown     Medications Prior to Admission  Medication Sig Dispense Refill Last Dose/Taking   Doxylamine-Pyridoxine  10-10 MG TBEC Take 1 tablet by mouth See admin instructions. Initial, 2 tablets  orally at bedtime on day 1 and 2; if symptoms persist, take 1 tablet in morning and 2 tablets at bedtime on day 3; iF symptoms persist, may increase to MAX 4 tablets per day, administered as 1 tablet in the morning, 1 tablet in mid-afternoon and 2 tablets at bedtime 60 tablet 0 12/05/2023   ondansetron (ZOFRAN-ODT) 4 MG disintegrating tablet Take 1 tablet (4 mg total) by mouth every 8 (eight) hours as needed. 20 tablet 0 12/05/2023   potassium chloride SA (KLOR-CON M) 20 MEQ tablet Take 1 tablet (20 mEq total) by mouth once for 1 dose. 4 tablet 0     Review of Systems  Constitutional:  Negative for chills and fever.  HENT:  Negative for congestion.   Eyes:  Negative for visual disturbance.  Respiratory:  Negative for shortness of breath.   Cardiovascular:  Negative for chest pain.  Gastrointestinal:  Positive for abdominal pain, nausea and vomiting. Negative for diarrhea.  Genitourinary:  Negative for vaginal bleeding and vaginal  discharge.  Neurological:  Negative for headaches.   Physical Exam   Blood pressure 122/75, pulse 87, temperature 98.2 F (36.8 C), resp. rate 18, height 5\' 2"  (1.575 m), weight 65.3 kg, last menstrual period 10/20/2023, unknown if currently breastfeeding.  Physical Exam Vitals reviewed.  Constitutional:      General: She is not in acute distress.    Appearance: Normal appearance. She is ill-appearing.  HENT:     Right Ear: External ear normal.     Left Ear: External ear normal.     Nose: Nose normal.     Mouth/Throat:     Mouth: Mucous membranes are moist.  Eyes:     Pupils: Pupils are equal, round, and reactive to light.  Cardiovascular:     Rate and Rhythm: Normal rate.     Pulses: Normal pulses.  Pulmonary:     Effort: Pulmonary effort is normal.  Abdominal:     General: Abdomen is flat.     Tenderness: There is no abdominal tenderness.  Musculoskeletal:      Cervical back: Normal range of motion.  Skin:    General: Skin is warm.     Capillary Refill: Capillary refill takes less than 2 seconds.  Neurological:     General: No focal deficit present.     Mental Status: She is alert.  Psychiatric:        Mood and Affect: Mood normal.     MAU Course  Procedures  MDM CMP IV Zofran 1 L fluid bolus IV Pepcid Scopolamine patch Urinalysis  Assessment and Plan  Cindy Hunter isa 32 yo [id number] @ [redacted]w[redacted]d presenting for nausea and vomiting in early pregnancy.  Nausea and vomiting Patient presenting for evaluation for nausea and vomiting.  Was seen on 12/22 and given fluids and Zofran and symptoms improved.  Discharged with Zofran and Diclegis but patient reports she cannot keep the Diclegis down and the Zofran was not helping at home.  Has been unable to keep solids or liquids down since being seen. - CMP collected showing no abnormalities - Urinalysis showing concentrated urine with 100 protein and 80 of ketones.  Likely due to dehydration - Patient symptoms improved with Zofran and fluids - Patient tolerating p.o. liquids but reported reflux so given PO Pepcid  - Scopolamine patch placed - Patient requesting p.o. solids, given graham crackers - Patient discharged home with prescription for p.o. Pepcid as well as scopolamine patches - Strict return precautions given.  Cindy Hunter 12/06/2023, 11:02 AM

## 2023-12-06 NOTE — Discharge Instructions (Signed)
It was a pleasure taking care of you today.  I am glad that the fluids and medications helped.  I sent a prescription for scopolamine patches to your pharmacy.  Also sent a prescription for Pepcid which she can take daily for the reflux symptoms.  Be sure to continue to drink as much fluid as you can tolerate it to keep yourself from dehydration.  If your symptoms worsen or you have any concerns please return.  I hope you have a wonderful day!

## 2024-05-05 ENCOUNTER — Encounter (HOSPITAL_COMMUNITY): Payer: Self-pay

## 2024-05-05 ENCOUNTER — Inpatient Hospital Stay (HOSPITAL_COMMUNITY)
Admission: AD | Admit: 2024-05-05 | Discharge: 2024-05-05 | Disposition: A | Discharge status: Home / Self Care | Attending: Obstetrics and Gynecology | Admitting: Obstetrics and Gynecology

## 2024-05-05 DIAGNOSIS — O219 Vomiting of pregnancy, unspecified: Secondary | ICD-10-CM

## 2024-05-05 DIAGNOSIS — K2100 Gastro-esophageal reflux disease with esophagitis, without bleeding: Secondary | ICD-10-CM | POA: Diagnosis not present

## 2024-05-05 DIAGNOSIS — K117 Disturbances of salivary secretion: Secondary | ICD-10-CM

## 2024-05-05 DIAGNOSIS — R112 Nausea with vomiting, unspecified: Secondary | ICD-10-CM | POA: Diagnosis present

## 2024-05-05 DIAGNOSIS — Z3A01 Less than 8 weeks gestation of pregnancy: Secondary | ICD-10-CM

## 2024-05-05 DIAGNOSIS — O26891 Other specified pregnancy related conditions, first trimester: Secondary | ICD-10-CM | POA: Diagnosis not present

## 2024-05-05 LAB — COMPREHENSIVE METABOLIC PANEL WITH GFR
ALT: 35 U/L (ref 0–44)
AST: 26 U/L (ref 15–41)
Albumin: 4.3 g/dL (ref 3.5–5.0)
Alkaline Phosphatase: 39 U/L (ref 38–126)
Anion gap: 17 — ABNORMAL HIGH (ref 5–15)
BUN: 13 mg/dL (ref 6–20)
CO2: 21 mmol/L — ABNORMAL LOW (ref 22–32)
Calcium: 9.5 mg/dL (ref 8.9–10.3)
Chloride: 97 mmol/L — ABNORMAL LOW (ref 98–111)
Creatinine, Ser: 0.88 mg/dL (ref 0.44–1.00)
GFR, Estimated: >60 mL/min (ref >60)
Glucose, Bld: 86 mg/dL (ref 70–99)
Potassium: 3.4 mmol/L — ABNORMAL LOW (ref 3.5–5.1)
Sodium: 135 mmol/L (ref 135–145)
Total Bilirubin: 1.3 mg/dL — ABNORMAL HIGH (ref 0.0–1.2)
Total Protein: 7.8 g/dL (ref 6.5–8.1)

## 2024-05-05 LAB — CBC WITH DIFFERENTIAL/PLATELET
Abs Immature Granulocytes: 0.06 10*3/uL (ref 0.00–0.07)
Basophils Absolute: 0 10*3/uL (ref 0.0–0.1)
Basophils Relative: 0 %
Eosinophils Absolute: 0 10*3/uL (ref 0.0–0.5)
Eosinophils Relative: 0 %
HCT: 42.3 % (ref 36.0–46.0)
Hemoglobin: 14.5 g/dL (ref 12.0–15.0)
Immature Granulocytes: 1 %
Lymphocytes Relative: 12 %
Lymphs Abs: 1.5 10*3/uL (ref 0.7–4.0)
MCH: 28.8 pg (ref 26.0–34.0)
MCHC: 34.3 g/dL (ref 30.0–36.0)
MCV: 83.9 fL (ref 80.0–100.0)
Monocytes Absolute: 1 10*3/uL (ref 0.1–1.0)
Monocytes Relative: 8 %
Neutro Abs: 9.9 10*3/uL — ABNORMAL HIGH (ref 1.7–7.7)
Neutrophils Relative %: 79 %
Platelets: 258 10*3/uL (ref 150–400)
RBC: 5.04 MIL/uL (ref 3.87–5.11)
RDW: 12.4 % (ref 11.5–15.5)
WBC: 12.4 10*3/uL — ABNORMAL HIGH (ref 4.0–10.5)
nRBC: 0 % (ref 0.0–0.2)

## 2024-05-05 LAB — URINALYSIS, ROUTINE W REFLEX MICROSCOPIC
Bilirubin Urine: NEGATIVE
Glucose, UA: NEGATIVE mg/dL
Ketones, ur: 80 mg/dL — AB
Leukocytes,Ua: NEGATIVE
Nitrite: NEGATIVE
Protein, ur: 100 mg/dL — AB
Specific Gravity, Urine: 1.031 — ABNORMAL HIGH (ref 1.005–1.030)
pH: 5 (ref 5.0–8.0)

## 2024-05-05 LAB — POCT PREGNANCY, URINE: Preg Test, Ur: POSITIVE — AB

## 2024-05-05 MED ORDER — LACTATED RINGERS IV BOLUS
1000.0000 mL | Freq: Once | INTRAVENOUS | Status: AC
  Administered 2024-05-05: 1000 mL via INTRAVENOUS

## 2024-05-05 MED ORDER — GLYCOPYRROLATE 1 MG PO TABS: 1.0000 mg | ORAL_TABLET | Freq: Three times a day (TID) | ORAL | 0 refills | Status: AC

## 2024-05-05 MED ORDER — SCOPOLAMINE 1 MG/3DAYS TD PT72
1.0000 | MEDICATED_PATCH | Freq: Once | TRANSDERMAL | Status: DC
  Administered 2024-05-05: 1.5 mg via TRANSDERMAL
  Filled 2024-05-05: qty 1

## 2024-05-05 MED ORDER — ONDANSETRON 4 MG PO TBDP: 4.0000 mg | ORAL_TABLET | Freq: Three times a day (TID) | ORAL | 0 refills | Status: DC | PRN

## 2024-05-05 MED ORDER — ALUM & MAG HYDROXIDE-SIMETH 200-200-20 MG/5ML PO SUSP
30.0000 mL | Freq: Once | ORAL | Status: AC
  Administered 2024-05-05: 30 mL via ORAL
  Filled 2024-05-05: qty 30

## 2024-05-05 MED ORDER — GLYCOPYRROLATE 0.2 MG/ML IJ SOLN
0.2000 mg | Freq: Once | INTRAMUSCULAR | Status: AC
  Administered 2024-05-05: 0.2 mg via INTRAVENOUS
  Filled 2024-05-05: qty 1

## 2024-05-05 MED ORDER — SCOPOLAMINE 1 MG/3DAYS TD PT72: 1.0000 | MEDICATED_PATCH | Freq: Once | TRANSDERMAL | 1 refills | Status: AC

## 2024-05-05 MED ORDER — ONDANSETRON 4 MG PO TBDP: 4.0000 mg | ORAL_TABLET | Freq: Three times a day (TID) | ORAL | 2 refills | Status: AC | PRN

## 2024-05-05 MED ORDER — PANTOPRAZOLE SODIUM 40 MG IV SOLR
40.0000 mg | Freq: Once | INTRAVENOUS | Status: AC
  Administered 2024-05-05: 40 mg via INTRAVENOUS
  Filled 2024-05-05: qty 10

## 2024-05-05 MED ORDER — PANTOPRAZOLE SODIUM 20 MG PO TBEC: 20.0000 mg | DELAYED_RELEASE_TABLET | Freq: Every day | ORAL | 9 refills | Status: AC

## 2024-05-05 MED ORDER — DICYCLOMINE HCL 10 MG/5ML PO SOLN
10.0000 mg | Freq: Once | ORAL | Status: AC
  Administered 2024-05-05: 10 mg via ORAL
  Filled 2024-05-05: qty 5

## 2024-05-05 NOTE — Discharge Instructions (Signed)

## 2024-05-05 NOTE — MAU Provider Note (Addendum)
 None     S [name] is a 33 y.o. [phone] pregnant female at 7 weeks 2 days who presents to MAU today with complaint of nausea and vomiting that has been present since she found out she was pregnant. Has not been able to keep anything down for 2 weeks per patient report. She reports she has lost count of how many times she has vomited in the last 24 hours. She reports she took zofran  at home without relief and is allergic to phenergan . She reports she does have transportation home.   She has not yet established care this pregnancy.   Pertinent items noted in HPI and remainder of comprehensive ROS otherwise negative.   O BP 124/73 (BP Location: Left Arm)   Pulse 95   Temp 98 F (36.7 C) (Oral)   Resp 18   Ht 5\' 2"  (1.575 m)   Wt 63.4 kg   LMP 03/15/2024   SpO2 100%   Breastfeeding Unknown   BMI 25.55 kg/m  Physical Exam Vitals and nursing note reviewed.  Constitutional:      General: She is not in acute distress.    Appearance: Normal appearance. She is ill-appearing. She is not toxic-appearing.  HENT:     Head: Normocephalic.  Cardiovascular:     Rate and Rhythm: Normal rate.     Pulses: Normal pulses.  Pulmonary:     Effort: Pulmonary effort is normal.  Skin:    General: Skin is warm and dry.     Capillary Refill: Capillary refill takes less than 2 seconds.  Neurological:     General: No focal deficit present.     Mental Status: She is alert and oriented to person, place, and time.  Psychiatric:        Mood and Affect: Mood normal.        Behavior: Behavior normal.        Thought Content: Thought content normal.      MDM: Fluids for rehydration given ketones present in urine, demonstrating dehydration. Will trial scopolamine , protonix, robinul for nausea and salivation. Good relief, able to tolerate fluids (declines solids)  but still reports dyspepsia. Will trial GI cocktail.   MAU Course:  A Nausea and vomiting of pregnancy,  antepartum  Gastroesophageal reflux disease with esophagitis without hemorrhage  [redacted] weeks gestation of pregnancy  Ptyalism  Medical screening exam complete  P Discharge from MAU in stable condition with routine precautions Follow up at preferred OB, list provided.   Robinul 1mg  3x daily PRN for ptyalism, protonix 20mg  daily for GERD, scopolamine  1 mg q 72h and zofran  4mg  q8h for nausea and vomiting of pregnancy all sent to preferred pharmacy.   Allergies as of 05/05/2024       Reactions   Penicillins Hives, Rash   Phenergan  [promethazine  Hcl] Nausea And Vomiting   Pt tolerated IV Phenergan  without complication on 11/05/22   Promethazine  Nausea And Vomiting   Unknown        Medication List     STOP taking these medications    potassium chloride  SA 20 MEQ tablet Commonly known as: KLOR-CON  M       TAKE these medications    Doxylamine -Pyridoxine  10-10 MG Tbec Take 1 tablet by mouth See admin instructions. Initial, 2 tablets  orally at bedtime on day 1 and 2; if symptoms persist, take 1 tablet in morning and 2 tablets at bedtime on day 3; iF symptoms persist, may increase to MAX 4 tablets per day,  administered as 1 tablet in the morning, 1 tablet in mid-afternoon and 2 tablets at bedtime   famotidine  40 MG tablet Commonly known as: Pepcid  Take 1 tablet (40 mg total) by mouth daily.   glycopyrrolate 1 MG tablet Commonly known as: Robinul Take 1 tablet (1 mg total) by mouth 3 (three) times daily.   ondansetron  4 MG disintegrating tablet Commonly known as: ZOFRAN -ODT Take 1 tablet (4 mg total) by mouth every 8 (eight) hours as needed.   pantoprazole 20 MG tablet Commonly known as: PROTONIX Take 1 tablet (20 mg total) by mouth daily.   Transderm-Scop 1 MG/3DAYS Generic drug: scopolamine  Place 1 patch (1.5 mg total) onto the skin every 3 (three) days. What changed: Another medication with the same name was added. Make sure you understand how and when to take each.    scopolamine  1 MG/3DAYS Commonly known as: TRANSDERM-SCOP Place 1 patch (1.5 mg total) onto the skin once for 1 dose. What changed: You were already taking a medication with the same name, and this prescription was added. Make sure you understand how and when to take each.        Raford Bunk, MSN, CNM 05/05/2024 2:24 PM  Certified Nurse Midwife, [facility]

## 2024-05-05 NOTE — MAU Note (Signed)
..  Cindy Hunter is a 33 y.o.  here in MAU reporting: ongoing nausea and vomiting since she found out she was pregnant. She also reports that she gets very lightheaded, weak, and "sees stars." Denies vb or abdominal pain. Has not been able to keep anything down for x2 weeks. [facility] [place]   LMP: 03/15/24 Pain score: 0 Vitals:   05/05/24 0949  BP: 101/85  Pulse: 96  Resp: 14  Temp: 98 F (36.7 C)  SpO2: 100%     FHT:n/a Lab orders placed from triage:   UPT, UA

## 2024-07-23 ENCOUNTER — Encounter (HOSPITAL_BASED_OUTPATIENT_CLINIC_OR_DEPARTMENT_OTHER): Payer: Self-pay | Admitting: Emergency Medicine

## 2024-07-23 ENCOUNTER — Other Ambulatory Visit: Payer: Self-pay

## 2024-07-23 ENCOUNTER — Emergency Department (HOSPITAL_BASED_OUTPATIENT_CLINIC_OR_DEPARTMENT_OTHER): Admission: EM | Admit: 2024-07-23 | Discharge: 2024-07-23 | Disposition: A | Discharge status: Home / Self Care

## 2024-07-23 DIAGNOSIS — J3489 Other specified disorders of nose and nasal sinuses: Secondary | ICD-10-CM | POA: Insufficient documentation

## 2024-07-23 DIAGNOSIS — J069 Acute upper respiratory infection, unspecified: Secondary | ICD-10-CM | POA: Diagnosis not present

## 2024-07-23 DIAGNOSIS — J029 Acute pharyngitis, unspecified: Secondary | ICD-10-CM | POA: Diagnosis present

## 2024-07-23 LAB — PREGNANCY, URINE: Preg Test, Ur: NEGATIVE

## 2024-07-23 LAB — GROUP A STREP BY PCR: Group A Strep by PCR: NOT DETECTED

## 2024-07-23 MED ORDER — KETOROLAC TROMETHAMINE 15 MG/ML IJ SOLN
15.0000 mg | Freq: Once | INTRAMUSCULAR | Status: AC
  Administered 2024-07-23 (×2): 15 mg via INTRAMUSCULAR
  Filled 2024-07-23: qty 1

## 2024-07-23 NOTE — ED Triage Notes (Signed)
 Pt  c/o sore throat x 2 days, BL ear pain, R eye pain.  Denies fever.

## 2024-07-23 NOTE — ED Provider Notes (Signed)
 [facility] Provider Note   CSN: [id number] Arrival date & time: 07/23/24  1646     Patient presents with: Sore Throat   Cindy Hunter is a 33 y.o. female with non-contributory PMHx who presents to ED concerned for fever, congestion, rhinorrhea, and sore throat x2 days. During initial interview, patient stating I'm not talking to you. Just give me my medicine and I am getting out of here. Patient eventually allowing me to look at her tonsils and would like a Toradol  shot while she is here in the ED to help with her symptoms. Rest of history is difficult to obtain as patient is talking to her friends on the phone during interview and repeating that she is ready to go home.     Sore Throat       Prior to Admission medications   Medication Sig Start Date End Date Taking? Authorizing Provider  Doxylamine -Pyridoxine  10-10 MG TBEC Take 1 tablet by mouth See admin instructions. Initial, 2 tablets  orally at bedtime on day 1 and 2; if symptoms persist, take 1 tablet in morning and 2 tablets at bedtime on day 3; iF symptoms persist, may increase to MAX 4 tablets per day, administered as 1 tablet in the morning, 1 tablet in mid-afternoon and 2 tablets at bedtime 12/02/23   Silver Fell A, PA  famotidine  (PEPCID ) 40 MG tablet Take 1 tablet (40 mg total) by mouth daily. 12/06/23   Cresenzo, Norleen GAILS, MD  glycopyrrolate  (ROBINUL ) 1 MG tablet Take 1 tablet (1 mg total) by mouth 3 (three) times daily. 05/05/24   Warren-Hill, Camie LABOR, CNM  ondansetron  (ZOFRAN -ODT) 4 MG disintegrating tablet Take 1 tablet (4 mg total) by mouth every 8 (eight) hours as needed. 05/05/24   Warren-Hill, Camie LABOR, CNM  pantoprazole  (PROTONIX ) 20 MG tablet Take 1 tablet (20 mg total) by mouth daily. 05/05/24   Warren-Hill, Camie LABOR, CNM  scopolamine  (TRANSDERM-SCOP) 1 MG/3DAYS Place 1 patch (1.5 mg total) onto the skin every 3 (three) days. 12/06/23   Cresenzo, John V, MD     Allergies: Penicillins, Phenergan  [promethazine  hcl], and Promethazine     Review of Systems  HENT:  Positive for sore throat.     Updated Vital Signs BP 120/74   Pulse 90   Temp 98.3 F (36.8 C) (Oral)   Resp 20   Ht 5' 2 (1.575 m)   LMP 06/18/2024 (Exact Date)   SpO2 100%   Breastfeeding No   BMI 25.55 kg/m   Physical Exam Vitals and nursing note reviewed.  Constitutional:      General: She is not in acute distress.    Appearance: She is not ill-appearing or toxic-appearing.  HENT:     Head: Normocephalic and atraumatic.     Mouth/Throat:     Mouth: Mucous membranes are moist. No oral lesions.     Pharynx: Oropharynx is clear. Uvula midline. No pharyngeal swelling, oropharyngeal exudate or posterior oropharyngeal erythema.     Tonsils: No tonsillar exudate or tonsillar abscesses. 1+ on the right. 1+ on the left.  Eyes:     General: No scleral icterus.       Right eye: No discharge.        Left eye: No discharge.     Conjunctiva/sclera: Conjunctivae normal.  Cardiovascular:     Rate and Rhythm: Normal rate.  Pulmonary:     Effort: Pulmonary effort is normal.  Abdominal:     General: Abdomen is flat.  Skin:    General: Skin is warm and dry.  Neurological:     General: No focal deficit present.     Mental Status: She is alert. Mental status is at baseline.  Psychiatric:        Mood and Affect: Mood normal.        Behavior: Behavior normal.     (all labs ordered are listed, but only abnormal results are displayed) Labs Reviewed  GROUP A STREP BY PCR  PREGNANCY, URINE    EKG: None  Radiology: No results found.   Procedures   Medications Ordered in the ED  ketorolac  (TORADOL ) 15 MG/ML injection 15 mg (has no administration in time range)                                    Medical Decision Making Amount and/or Complexity of Data Reviewed Labs: ordered.    This patient presents to the ED for concern of fever, congestion, rhinorrhea, sore  throat, this involves an extensive number of treatment options, and is a complaint that carries with it a high risk of complications and morbidity.  The differential diagnosis includes Flu/COVID/RSV, strep pharyngitis, sinusitis, peritonsillar abscess, retropharyngeal abscess, pneumonia, meningitis.   Co morbidities that complicate the patient evaluation  none   Additional history obtained:  No PCP listed in chart   Problem List / ED Course / Critical interventions / Medication management  Patient presents ED concern for fever, congestion, rhinorrhea, sore throat x 2 days.  Patient refusing to give further history.  Patient talking on the phone with friends during the interview.  Patient repeatedly requesting to go home. Physical exam reassuring.  Patient afebrile with stable vitals. I Ordered, and personally interpreted labs.  Strep PCR negative.  UPT negative. Patient was educated on alternating between 650 mg Tylenol  and 400 mg ibuprofen  every 3 hours as needed for pain and the benefit that honey may provide for cough symptoms. I have reviewed the patients home medicines and have made adjustments as needed The patient has been appropriately medically screened and/or stabilized in the ED. I have low suspicion for any other emergent medical condition which would require further screening, evaluation or treatment in the ED or require inpatient management. At time of discharge the patient is hemodynamically stable and in no acute distress. I have discussed work-up results and diagnosis with patient and answered all questions. Patient is agreeable with discharge plan. We discussed strict return precautions for returning to the emergency department and they verbalized understanding.    Social Determinants of Health:  none      Final diagnoses:  Viral upper respiratory tract infection    ED Discharge Orders     None          Hoy Nidia JULIANNA DEVONNA 07/23/24 2127    Simon Lavonia SAILOR, MD 07/26/24 [phone]

## 2024-07-23 NOTE — Discharge Instructions (Addendum)
 Please follow-up with primary care provider.  Seek emergency care if experiencing any new or worsening symptoms.  Alternating between 650 mg Tylenol  and 400 mg Advil : The best way to alternate taking Acetaminophen  (example Tylenol ) and Ibuprofen  (example Advil /Motrin ) is to take them 3 hours apart. For example, if you take ibuprofen  at 6 am you can then take Tylenol  at 9 am. You can continue this regimen throughout the day, making sure you do not exceed the recommended maximum dose for each drug.'
